# Patient Record
Sex: Female | Born: 1940 | Race: White | Hispanic: No | Marital: Married | Smoking: Former smoker
Health system: Southern US, Community
[De-identification: ages and names within clinical notes are randomized; demographics above are authoritative.]

## PROBLEM LIST (undated history)

## (undated) DIAGNOSIS — T7840XA Allergy, unspecified, initial encounter: Secondary | ICD-10-CM

## (undated) DIAGNOSIS — Z8619 Personal history of other infectious and parasitic diseases: Secondary | ICD-10-CM

## (undated) HISTORY — DX: Allergy, unspecified, initial encounter: T78.40XA

## (undated) HISTORY — DX: Personal history of other infectious and parasitic diseases: Z86.19

---

## 2020-04-18 ENCOUNTER — Ambulatory Visit: Payer: Self-pay | Admitting: Family Medicine

## 2020-05-07 ENCOUNTER — Encounter: Payer: Self-pay | Admitting: Family Medicine

## 2020-05-07 ENCOUNTER — Ambulatory Visit (INDEPENDENT_AMBULATORY_CARE_PROVIDER_SITE_OTHER): Payer: Medicare Other | Admitting: Family Medicine

## 2020-05-07 ENCOUNTER — Other Ambulatory Visit: Payer: Self-pay

## 2020-05-07 VITALS — BP 164/90 | HR 114 | Temp 98.7°F | Ht 62.0 in | Wt 128.5 lb

## 2020-05-07 DIAGNOSIS — R011 Cardiac murmur, unspecified: Secondary | ICD-10-CM | POA: Diagnosis not present

## 2020-05-07 DIAGNOSIS — M25561 Pain in right knee: Secondary | ICD-10-CM | POA: Diagnosis not present

## 2020-05-07 DIAGNOSIS — E2839 Other primary ovarian failure: Secondary | ICD-10-CM

## 2020-05-07 DIAGNOSIS — R030 Elevated blood-pressure reading, without diagnosis of hypertension: Secondary | ICD-10-CM | POA: Diagnosis not present

## 2020-05-07 DIAGNOSIS — Z13220 Encounter for screening for lipoid disorders: Secondary | ICD-10-CM

## 2020-05-07 DIAGNOSIS — R5383 Other fatigue: Secondary | ICD-10-CM

## 2020-05-07 MED ORDER — SHINGRIX 50 MCG/0.5ML IM SUSR: 0.5000 mL | Freq: Once | INTRAMUSCULAR | 0 refills | Status: AC

## 2020-05-07 NOTE — Progress Notes (Signed)
Suzanne Morris DOB: 17-Nov-1940 Encounter date: 05/07/2020  This is a 79 y.o. female who presents to establish care. Chief Complaint  Patient presents with  . Establish Care    History of present illness:  She is anxious, feels that it aggravated bp today. She has not had regular doctor of any sort in decades.   Has to get dental implants; worries about this.   Right knee pain. Had abrupt stop playing tennis that caused some discomfort and then slight twist/landing on knee that made it sore. This occurred back in July. It is better, but not 100%. She has not resumed tennis. She is afraid to worsen problem. No swelling, no bruising.   Seasonal allergies - mostly in spring. Seems related to grass. Sneezing, running nose. Takes otc alavert if she can find it without making her drowsy.   Exercises regularly tennis, walking.   Husband had detached retina. This was stressful for her. 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 Just easily worries about things.  Never had mammogram. Had bone density done through [facility] and this was good.   Usually takes emergen-C during flu season.   Does check bp at home on occasion yesterday was 129/68.   Energy is not quite what it used to be but generally good.   Past Medical History:  Diagnosis Date  . Allergy   . History of chickenpox    History reviewed. No pertinent surgical history. No Known Allergies Current Meds  Medication Sig  . Coenzyme Q10 (COQ10 PO) Take by mouth daily.  . Multiple Vitamins-Minerals (MULTIVITAMIN ADULT) CHEW Chew by mouth.   Social History   Tobacco Use  . Smoking status: Former Smoker    Packs/day: 0.25    Years: 2.00    Pack years: 0.50  . Smokeless tobacco: Never Used  Substance Use Topics  . Alcohol use: Yes    Alcohol/week: 3.0 - 4.0 standard drinks    Types: 3 - 4 Glasses of wine per week   Family History  Problem Relation Age of Onset  . Hypertension Mother   . Kidney disease Son         underdeveloped kidneys, transplant  . Other Father        rheumatic fever  . COPD Father        smoker  . Heart failure Father      Review of Systems  Constitutional: Negative for chills, fatigue and fever.  Respiratory: Negative for cough, chest tightness, shortness of breath and wheezing.   Cardiovascular: Negative for chest pain, palpitations and leg swelling.  Musculoskeletal: Positive for arthralgias.    Objective:  BP (!) 164/90 (BP Location: Left Arm, Patient Position: Sitting, Cuff Size: Normal)   Pulse (!) 114   Temp 98.7 F (37.1 C) (Oral)   Ht 5\' 2"  (1.575 m)   Wt 128 lb 8 oz (58.3 kg)   BMI 23.50 kg/m   Weight: 128 lb 8 oz (58.3 kg)   BP Readings from Last 3 Encounters:  05/07/20 (!) 164/90   Wt Readings from Last 3 Encounters:  05/07/20 128 lb 8 oz (58.3 kg)    Physical Exam Constitutional:      General: She is not in acute distress.    Appearance: She is well-developed.  Cardiovascular:     Rate and Rhythm: Regular rhythm. Tachycardia present.     Heart sounds: Murmur heard.  Crescendo decrescendo systolic murmur is present with a grade of 2/6.  No friction rub.  Pulmonary:     Effort: Pulmonary effort is normal. No respiratory distress.     Breath sounds: Normal breath sounds. No wheezing or rales.  Musculoskeletal:     Right lower leg: No edema.     Left lower leg: No edema.     Comments: Full range of motion bilateral knees.  No instability, medial or lateral joint line tenderness, no patellar tenderness, no laxity of the joint.  Does feel that there is mild edema of the right knee.  Neurological:     Mental Status: She is alert and oriented to person, place, and time.  Psychiatric:        Behavior: Behavior normal.     Assessment/Plan: 1. Elevated blood pressure reading She is very anxious today.  She has not had a regular doctor and is nervous about seeing 1.  Recheck of her blood pressure was still high.  I have encouraged her to check at  home and to report numbers back to me we may check in with blood work results.  Follow-up pending her report. - CBC with Differential/Platelet; Future - Comprehensive metabolic panel; Future - Comprehensive metabolic panel - CBC with Differential/Platelet  2. Murmur, cardiac Since she is a new patient and murmur is pronounced, I think it is reasonable to obtain a baseline echo.  We discussed this together in the office. - ECHOCARDIOGRAM COMPLETE; Future  3. Right knee pain, unspecified chronicity I have encouraged her to slowly resume activity with the knee to see if it bothers her.  She has been walking, but not running.  I encouraged her to start with light jogging and see if the knee hurts.  If so, we can further evaluate either with x-ray/imaging or can send a specialist.  4. Lipid screening - Lipid panel; Future - Lipid panel  5. Fatigue, unspecified type Start with blood work.  Patient reports this is mild, but since she has not had any recent blood work will make sure to include B6 that could contribute to fatigue. - TSH; Future - TSH  6. Estrogen deficiency - DG Bone Density; Future   We discussed other routine preventative healthcare measures including colonoscopy/Cologuard, mammogram, immunizations.  She does not wish to pursue anything besides what we started with today.  We will rediscuss at next visit.  Return for pending labs.  Theodis Shove, MD

## 2020-05-07 NOTE — Patient Instructions (Addendum)
You can set up your booster at [email or web address]   Consider shingles vaccine  Consider Tdap (this is tetanus with pertussis and you can complete at the pharmacy)  Check your blood pressure at home and write down numbers. We want you to have blood pressures 135/85 or less routinely when you check. I will check back in with you about this.

## 2020-05-10 LAB — CBC WITH DIFFERENTIAL/PLATELET
Absolute Monocytes: 489 cells/uL (ref 200–950)
Basophils Absolute: 44 cells/uL (ref 0–200)
Basophils Relative: 0.6 %
Eosinophils Absolute: 88 cells/uL (ref 15–500)
Eosinophils Relative: 1.2 %
HCT: 44.8 % (ref 35.0–45.0)
Hemoglobin: 14.8 g/dL (ref 11.7–15.5)
Lymphs Abs: 2343 cells/uL (ref 850–3900)
MCH: 30.5 pg (ref 27.0–33.0)
MCHC: 33 g/dL (ref 32.0–36.0)
MCV: 92.2 fL (ref 80.0–100.0)
MPV: 11.1 fL (ref 7.5–12.5)
Monocytes Relative: 6.7 %
Neutro Abs: 4336 cells/uL (ref 1500–7800)
Neutrophils Relative %: 59.4 %
Platelets: 263 10*3/uL (ref 140–400)
RBC: 4.86 10*6/uL (ref 3.80–5.10)
RDW: 12.2 % (ref 11.0–15.0)
Total Lymphocyte: 32.1 %
WBC: 7.3 10*3/uL (ref 3.8–10.8)

## 2020-05-10 LAB — LIPID PANEL
Cholesterol: 247 mg/dL — ABNORMAL HIGH (ref <200)
HDL: 63 mg/dL (ref >=50)
LDL Cholesterol (Calc): 161 mg/dL (calc) — ABNORMAL HIGH
Non-HDL Cholesterol (Calc): 184 mg/dL (calc) — ABNORMAL HIGH (ref <130)
Total CHOL/HDL Ratio: 3.9 (calc) (ref <5.0)
Triglycerides: 110 mg/dL (ref <150)

## 2020-05-10 LAB — COMPREHENSIVE METABOLIC PANEL
AG Ratio: 1.7 (calc) (ref 1.0–2.5)
ALT: 17 U/L (ref 6–29)
AST: 21 U/L (ref 10–35)
Albumin: 4.6 g/dL (ref 3.6–5.1)
Alkaline phosphatase (APISO): 78 U/L (ref 37–153)
BUN: 14 mg/dL (ref 7–25)
CO2: 29 mmol/L (ref 20–32)
Calcium: 10 mg/dL (ref 8.6–10.4)
Chloride: 105 mmol/L (ref 98–110)
Creat: 0.85 mg/dL (ref 0.60–0.93)
Globulin: 2.7 g/dL (calc) (ref 1.9–3.7)
Glucose, Bld: 118 mg/dL — ABNORMAL HIGH (ref 65–99)
Potassium: 5.6 mmol/L — ABNORMAL HIGH (ref 3.5–5.3)
Sodium: 140 mmol/L (ref 135–146)
Total Bilirubin: 0.6 mg/dL (ref 0.2–1.2)
Total Protein: 7.3 g/dL (ref 6.1–8.1)

## 2020-05-10 LAB — TEST AUTHORIZATION

## 2020-05-10 LAB — T4, FREE: Free T4: 1.1 ng/dL (ref 0.8–1.8)

## 2020-05-10 LAB — T3, FREE: T3, Free: 2.9 pg/mL (ref 2.3–4.2)

## 2020-05-10 LAB — TSH: TSH: 6.01 mIU/L — ABNORMAL HIGH (ref 0.40–4.50)

## 2020-05-20 ENCOUNTER — Other Ambulatory Visit: Payer: Self-pay | Admitting: Family Medicine

## 2020-05-20 DIAGNOSIS — E039 Hypothyroidism, unspecified: Secondary | ICD-10-CM

## 2020-05-20 DIAGNOSIS — E875 Hyperkalemia: Secondary | ICD-10-CM

## 2020-05-20 DIAGNOSIS — R739 Hyperglycemia, unspecified: Secondary | ICD-10-CM

## 2020-05-31 ENCOUNTER — Other Ambulatory Visit (HOSPITAL_COMMUNITY): Payer: Medicare Other

## 2020-06-18 ENCOUNTER — Other Ambulatory Visit (HOSPITAL_COMMUNITY): Payer: Medicare Other

## 2020-07-10 ENCOUNTER — Other Ambulatory Visit: Payer: Self-pay

## 2020-07-10 ENCOUNTER — Ambulatory Visit (HOSPITAL_COMMUNITY): Payer: Medicare Other | Attending: Internal Medicine

## 2020-07-10 DIAGNOSIS — R011 Cardiac murmur, unspecified: Secondary | ICD-10-CM | POA: Diagnosis not present

## 2020-07-10 LAB — ECHOCARDIOGRAM COMPLETE
AR max vel: 3.12 cm2
AV Area VTI: 3.28 cm2
AV Area mean vel: 3.06 cm2
AV Mean grad: 16 mmHg
AV Peak grad: 28.6 mmHg
Ao pk vel: 2.68 m/s
Area-P 1/2: 3.28 cm2
P 1/2 time: 418 msec
S' Lateral: 2.1 cm

## 2020-07-11 ENCOUNTER — Ambulatory Visit (INDEPENDENT_AMBULATORY_CARE_PROVIDER_SITE_OTHER): Payer: Medicare Other

## 2020-07-11 DIAGNOSIS — Z0000 Encounter for general adult medical examination without abnormal findings: Secondary | ICD-10-CM | POA: Diagnosis not present

## 2020-07-11 NOTE — Progress Notes (Signed)
Virtual Visit via Telephone Note  I connected with  Pearline Cables on 07/11/20 at  8:00 AM EST by telephone and verified that I am speaking with the correct person using two identifiers.  Medicare Annual Wellness visit completed telephonically due to Covid-19 pandemic.   Persons participating in this call: This Health Coach and this patient.   Location: Patient: Home Provider: Office   I discussed the limitations, risks, security and privacy concerns of performing an evaluation and management service by telephone and the availability of in person appointments. The patient expressed understanding and agreed to proceed.  Unable to perform video visit due to video visit attempted and failed and/or patient does not have video capability.   Some vital signs may be absent or patient reported.   Marzella Schlein, LPN    Subjective:   Suzanne Morris is a 79 y.o. female who presents for an Initial Medicare Annual Wellness Visit.  Review of Systems     Cardiac Risk Factors include: advanced age (>3men, >52 women)     Objective:    There were no vitals filed for this visit. There is no height or weight on file to calculate BMI.  Advanced Directives 07/11/2020  Does Patient Have a Medical Advance Directive? Yes  Does patient want to make changes to medical advance directive? No - Patient declined    Current Medications (verified) Outpatient Encounter Medications as of 07/11/2020  Medication Sig  . Coenzyme Q10 (COQ10 PO) Take by mouth daily.  . Multiple Vitamins-Minerals (MULTIVITAMIN ADULT) CHEW Chew by mouth.   No facility-administered encounter medications on file as of 07/11/2020.    Allergies (verified) Patient has no known allergies.   History: Past Medical History:  Diagnosis Date  . Allergy   . History of chickenpox    History reviewed. No pertinent surgical history. Family History  Problem Relation Age of Onset  . Hypertension Mother   . Kidney disease  Son        underdeveloped kidneys, transplant  . Other Father        rheumatic fever  . COPD Father        smoker  . Heart failure Father    Social History   Socioeconomic History  . Marital status: Married    Spouse name: Not on file  . Number of children: Not on file  . Years of education: Not on file  . Highest education level: Not on file  Occupational History  . Occupation: Retired  Tobacco Use  . Smoking status: Former Smoker    Packs/day: 0.25    Years: 2.00    Pack years: 0.50  . Smokeless tobacco: Never Used  Vaping Use  . Vaping Use: Never used  Substance and Sexual Activity  . Alcohol use: Yes    Alcohol/week: 3.0 - 4.0 standard drinks    Types: 3 - 4 Glasses of wine per week  . Drug use: Never  . Sexual activity: Yes    Partners: Male  Other Topics Concern  . Not on file  Social History Narrative  . Not on file   Social Determinants of Health   Financial Resource Strain: Low Risk   . Difficulty of Paying Living Expenses: Not hard at all  Food Insecurity: No Food Insecurity  . Worried About Programme researcher, broadcasting/film/video in the Last Year: Never true  . Ran Out of Food in the Last Year: Never true  Transportation Needs: No Transportation Needs  . Lack of Transportation (  Medical): No  . Lack of Transportation (Non-Medical): No  Physical Activity: Sufficiently Active  . Days of Exercise per Week: 5 days  . Minutes of Exercise per Session: 50 min  Stress: No Stress Concern Present  . Feeling of Stress : Not at all  Social Connections: Socially Integrated  . Frequency of Communication with Friends and Family: More than three times a week  . Frequency of Social Gatherings with Friends and Family: More than three times a week  . Attends Religious Services: More than 4 times per year  . Active Member of Clubs or Organizations: Yes  . Attends BankerClub or Organization Meetings: 1 to 4 times per year  . Marital Status: Married    Tobacco Counseling Counseling given:  Not Answered   Clinical Intake:  Pre-visit preparation completed: Yes  Pain : No/denies pain     BMI - recorded: 23.5 Nutritional Status: BMI of 19-24  Normal Nutritional Risks: None Diabetes: No  How often do you need to have someone help you when you read instructions, pamphlets, or other written materials from your doctor or pharmacy?: 1 - Never  Diabetic?No  Interpreter Needed?: No  Information entered by :: Lanier Ensignina Sou Nohr, LPN   Activities of Daily Living In your present state of health, do you have any difficulty performing the following activities: 07/11/2020  Hearing? N  Vision? N  Difficulty concentrating or making decisions? N  Walking or climbing stairs? N  Dressing or bathing? N  Doing errands, shopping? N  Preparing Food and eating ? N  Using the Toilet? N  In the past six months, have you accidently leaked urine? N  Do you have problems with loss of bowel control? N  Managing your Medications? N  Managing your Finances? N  Housekeeping or managing your Housekeeping? N  Some recent data might be hidden    Patient Care Team: Wynn BankerKoberlein, Junell C, MD as PCP - General (Family Medicine)  Indicate any recent Medical Services you may have received from other than [facility] providers in the past year (date may be approximate).     Assessment:   This is a routine wellness examination for Suzanne Morris.  Hearing/Vision screen  Hearing Screening   125Hz  250Hz  500Hz  1000Hz  2000Hz  3000Hz  4000Hz  6000Hz  8000Hz   Right ear:           Left ear:           Comments: Pt denies any hearing at this time  Vision Screening Comments: [facility]  Dietary issues and exercise activities discussed: Current Exercise Habits: Home exercise routine, Type of exercise: walking (swim and tennis), Time (Minutes): 45, Frequency (Times/Week): 5, Weekly Exercise (Minutes/Week): 225  Goals    . Patient Stated     Maintain health      Depression  Screen PHQ 2/9 Scores 07/11/2020  PHQ - 2 Score 0    Fall Risk Fall Risk  07/11/2020  Falls in the past year? 0  Number falls in past yr: 0  Injury with Fall? 0  Risk for fall due to : Impaired vision  Follow up Falls prevention discussed    FALL RISK PREVENTION PERTAINING TO THE HOME:  Any stairs in or around the home? Yes  If so, are there any without handrails? No  Home free of loose throw rugs in walkways, pet beds, electrical cords, etc? Yes  Adequate lighting in your home to reduce risk of falls? Yes   ASSISTIVE DEVICES UTILIZED TO PREVENT  FALLS:  Life alert? No  Use of a cane, walker or w/c? No  Grab bars in the bathroom? Yes  Shower chair or bench in shower? No  Elevated toilet seat or a handicapped toilet? No   TIMED UP AND GO:  Was the test performed? No .    Cognitive Function:     6CIT Screen 07/11/2020  What Year? 0 points  What month? 0 points  Count back from 20 0 points  Months in reverse 0 points  Repeat phrase 0 points    Immunizations Immunization History  Administered Date(s) Administered  . PFIZER SARS-COV-2 Vaccination 09/05/2019, 10/03/2019    TDAP status: Due, Education has been provided regarding the importance of this vaccine. Advised may receive this vaccine at local pharmacy or Health Dept. Aware to provide a copy of the vaccination record if obtained from local pharmacy or Health Dept. Verbalized acceptance and understanding.  Flu Vaccine status: Declined, Education has been provided regarding the importance of this vaccine but patient still declined. Advised may receive this vaccine at local pharmacy or Health Dept. Aware to provide a copy of the vaccination record if obtained from local pharmacy or Health Dept. Verbalized acceptance and understanding.  Pneumococcal vaccine status: Declined,  Education has been provided regarding the importance of this vaccine but patient still declined. Advised may receive this vaccine at local  pharmacy or Health Dept. Aware to provide a copy of the vaccination record if obtained from local pharmacy or Health Dept. Verbalized acceptance and understanding.   Covid-19 vaccine status: Completed vaccines  Qualifies for Shingles Vaccine? Yes   Zostavax completed No   Shingrix Completed?: No.    Education has been provided regarding the importance of this vaccine. Patient has been advised to call insurance company to determine out of pocket expense if they have not yet received this vaccine. Advised may also receive vaccine at local pharmacy or Health Dept. Verbalized acceptance and understanding.  Screening Tests Health Maintenance  Topic Date Due  . INFLUENZA VACCINE  11/01/2020 (Originally 03/04/2020)  . Hepatitis C Screening  05/07/2021 (Originally 05-Mar-1941)  . DEXA SCAN  07/11/2021 (Originally 12/30/2005)  . TETANUS/TDAP  07/11/2021 (Originally 12/31/1959)  . PNA vac Low Risk Adult (1 of 2 - PCV13) 07/11/2021 (Originally 12/30/2005)  . COVID-19 Vaccine  Completed    Health Maintenance  There are no preventive care reminders to display for this patient.  Colorectal cancer screening: No longer required.   Mammogram status: No longer required due to pt states she will speak to provider before making any test decisions.  Bone density declined until speaking with provider   Additional Screening:  Hepatitis C Screening: does qualify;   Vision Screening: Recommended annual ophthalmology exams for early detection of glaucoma and other disorders of the eye. Is the patient up to date with their annual eye exam?  Yes  Who is the provider or what is the name of the office in which the patient attends annual eye exams? [facility]   Dental Screening: Recommended annual dental exams for proper oral hygiene  Community Resource Referral / Chronic Care Management: CRR required this visit?  No   CCM required this visit?  No      Plan:     I have personally reviewed and noted  the following in the patient's chart:   . Medical and social history . Use of alcohol, tobacco or illicit drugs  . Current medications and supplements . Functional ability and status . Nutritional status .  Physical activity . Advanced directives . List of other physicians . Hospitalizations, surgeries, and ER visits in previous 12 months . Vitals . Screenings to include cognitive, depression, and falls . Referrals and appointments  In addition, I have reviewed and discussed with patient certain preventive protocols, quality metrics, and best practice recommendations. A written personalized care plan for preventive services as well as general preventive health recommendations were provided to patient.     Marzella Schlein, LPN   23/12/3612   Nurse Notes: None

## 2020-07-11 NOTE — Patient Instructions (Addendum)
Ms. Suzanne Morris , Thank you for taking time to come for your Medicare Wellness Visit. I appreciate your ongoing commitment to your health goals. Please review the following plan we discussed and let me know if I can assist you in the future.   Screening recommendations/referrals: Colonoscopy: Declined at this time/ no longer required  Mammogram: Declined  Bone Density: Declined Pt states she will confer with provider concerning further testing  Recommended yearly ophthalmology/optometry visit for glaucoma screening and checkup Recommended yearly dental visit for hygiene and checkup  Vaccinations: Influenza vaccine: Declined and discussed Pneumococcal vaccine: Declined and discussed Tdap vaccine: Declined and discussed Shingles vaccine: Declined and discussed   Covid-19:Completed 2/1, & 10/03/19  Advanced directives: Declined to provide copy at this time  Conditions/risks identified: Maintain health  Next appointment: Follow up in one year for your annual wellness visit    Preventive Care 65 Years and Older, Female Preventive care refers to lifestyle choices and visits with your health care provider that can promote health and wellness. What does preventive care include?  A yearly physical exam. This is also called an annual well check.  Dental exams once or twice a year.  Routine eye exams. Ask your health care provider how often you should have your eyes checked.  Personal lifestyle choices, including:  Daily care of your teeth and gums.  Regular physical activity.  Eating a healthy diet.  Avoiding tobacco and drug use.  Limiting alcohol use.  Practicing safe sex.  Taking low-dose aspirin every day.  Taking vitamin and mineral supplements as recommended by your health care provider. What happens during an annual well check? The services and screenings done by your health care provider during your annual well check will depend on your age, overall health, lifestyle risk  factors, and family history of disease. Counseling  Your health care provider may ask you questions about your:  Alcohol use.  Tobacco use.  Drug use.  Emotional well-being.  Home and relationship well-being.  Sexual activity.  Eating habits.  History of falls.  Memory and ability to understand (cognition).  Work and work Astronomer.  Reproductive health. Screening  You may have the following tests or measurements:  Height, weight, and BMI.  Blood pressure.  Lipid and cholesterol levels. These may be checked every 5 years, or more frequently if you are over 61 years old.  Skin check.  Lung cancer screening. You may have this screening every year starting at age 26 if you have a 30-pack-year history of smoking and currently smoke or have quit within the past 15 years.  Fecal occult blood test (FOBT) of the stool. You may have this test every year starting at age 73.  Flexible sigmoidoscopy or colonoscopy. You may have a sigmoidoscopy every 5 years or a colonoscopy every 10 years starting at age 36.  Hepatitis C blood test.  Hepatitis B blood test.  Sexually transmitted disease (STD) testing.  Diabetes screening. This is done by checking your blood sugar (glucose) after you have not eaten for a while (fasting). You may have this done every 1-3 years.  Bone density scan. This is done to screen for osteoporosis. You may have this done starting at age 76.  Mammogram. This may be done every 1-2 years. Talk to your health care provider about how often you should have regular mammograms. Talk with your health care provider about your test results, treatment options, and if necessary, the need for more tests. Vaccines  Your health care provider may  recommend certain vaccines, such as:  Influenza vaccine. This is recommended every year.  Tetanus, diphtheria, and acellular pertussis (Tdap, Td) vaccine. You may need a Td booster every 10 years.  Zoster vaccine. You  may need this after age 66.  Pneumococcal 13-valent conjugate (PCV13) vaccine. One dose is recommended after age 72.  Pneumococcal polysaccharide (PPSV23) vaccine. One dose is recommended after age 63. Talk to your health care provider about which screenings and vaccines you need and how often you need them. This information is not intended to replace advice given to you by your health care provider. Make sure you discuss any questions you have with your health care provider. Document Released: 08/17/2015 Document Revised: 04/09/2016 Document Reviewed: 05/22/2015 Elsevier Interactive Patient Education  2017 [employer] Prevention in the Home Falls can cause injuries. They can happen to people of all ages. There are many things you can do to make your home safe and to help prevent falls. What can I do on the outside of my home?  Regularly fix the edges of walkways and driveways and fix any cracks.  Remove anything that might make you trip as you walk through a door, such as a raised step or threshold.  Trim any bushes or trees on the path to your home.  Use bright outdoor lighting.  Clear any walking paths of anything that might make someone trip, such as rocks or tools.  Regularly check to see if handrails are loose or broken. Make sure that both sides of any steps have handrails.  Any raised decks and porches should have guardrails on the edges.  Have any leaves, snow, or ice cleared regularly.  Use sand or salt on walking paths during winter.  Clean up any spills in your garage right away. This includes oil or grease spills. What can I do in the bathroom?  Use night lights.  Install grab bars by the toilet and in the tub and shower. Do not use towel bars as grab bars.  Use non-skid mats or decals in the tub or shower.  If you need to sit down in the shower, use a plastic, non-slip stool.  Keep the floor dry. Clean up any water that spills on the floor as soon as  it happens.  Remove soap buildup in the tub or shower regularly.  Attach bath mats securely with double-sided non-slip rug tape.  Do not have throw rugs and other things on the floor that can make you trip. What can I do in the bedroom?  Use night lights.  Make sure that you have a light by your bed that is easy to reach.  Do not use any sheets or blankets that are too big for your bed. They should not hang down onto the floor.  Have a firm chair that has side arms. You can use this for support while you get dressed.  Do not have throw rugs and other things on the floor that can make you trip. What can I do in the kitchen?  Clean up any spills right away.  Avoid walking on wet floors.  Keep items that you use a lot in easy-to-reach places.  If you need to reach something above you, use a strong step stool that has a grab bar.  Keep electrical cords out of the way.  Do not use floor polish or wax that makes floors slippery. If you must use wax, use non-skid floor wax.  Do not have throw rugs and  other things on the floor that can make you trip. What can I do with my stairs?  Do not leave any items on the stairs.  Make sure that there are handrails on both sides of the stairs and use them. Fix handrails that are broken or loose. Make sure that handrails are as long as the stairways.  Check any carpeting to make sure that it is firmly attached to the stairs. Fix any carpet that is loose or worn.  Avoid having throw rugs at the top or bottom of the stairs. If you do have throw rugs, attach them to the floor with carpet tape.  Make sure that you have a light switch at the top of the stairs and the bottom of the stairs. If you do not have them, ask someone to add them for you. What else can I do to help prevent falls?  Wear shoes that:  Do not have high heels.  Have rubber bottoms.  Are comfortable and fit you well.  Are closed at the toe. Do not wear sandals.  If  you use a stepladder:  Make sure that it is fully opened. Do not climb a closed stepladder.  Make sure that both sides of the stepladder are locked into place.  Ask someone to hold it for you, if possible.  Clearly mark and make sure that you can see:  Any grab bars or handrails.  First and last steps.  Where the edge of each step is.  Use tools that help you move around (mobility aids) if they are needed. These include:  Canes.  Walkers.  Scooters.  Crutches.  Turn on the lights when you go into a dark area. Replace any light bulbs as soon as they burn out.  Set up your furniture so you have a clear path. Avoid moving your furniture around.  If any of your floors are uneven, fix them.  If there are any pets around you, be aware of where they are.  Review your medicines with your doctor. Some medicines can make you feel dizzy. This can increase your chance of falling. Ask your doctor what other things that you can do to help prevent falls. This information is not intended to replace advice given to you by your health care provider. Make sure you discuss any questions you have with your health care provider. Document Released: 05/17/2009 Document Revised: 12/27/2015 Document Reviewed: 08/25/2014 Elsevier Interactive Patient Education  2017 [employer].

## 2020-09-10 ENCOUNTER — Ambulatory Visit: Payer: Medicare Other | Admitting: Family Medicine

## 2020-10-05 ENCOUNTER — Ambulatory Visit: Payer: Medicare Other | Admitting: Family Medicine

## 2020-10-30 ENCOUNTER — Other Ambulatory Visit: Payer: Medicare Other

## 2020-10-31 ENCOUNTER — Encounter: Payer: Self-pay | Admitting: Family Medicine

## 2020-10-31 ENCOUNTER — Other Ambulatory Visit: Payer: Self-pay

## 2020-10-31 ENCOUNTER — Ambulatory Visit (INDEPENDENT_AMBULATORY_CARE_PROVIDER_SITE_OTHER): Payer: Medicare Other | Admitting: Family Medicine

## 2020-10-31 VITALS — BP 160/100 | HR 90 | Temp 98.1°F | Ht 62.0 in | Wt 123.5 lb

## 2020-10-31 DIAGNOSIS — I10 Essential (primary) hypertension: Secondary | ICD-10-CM | POA: Diagnosis not present

## 2020-10-31 DIAGNOSIS — F419 Anxiety disorder, unspecified: Secondary | ICD-10-CM | POA: Diagnosis not present

## 2020-10-31 DIAGNOSIS — I351 Nonrheumatic aortic (valve) insufficiency: Secondary | ICD-10-CM | POA: Diagnosis not present

## 2020-10-31 MED ORDER — ROSUVASTATIN CALCIUM 5 MG PO TABS: 5.0000 mg | ORAL_TABLET | Freq: Every day | ORAL | 3 refills | Status: DC

## 2020-10-31 MED ORDER — CLONAZEPAM 0.5 MG PO TABS: 0.2500 mg | ORAL_TABLET | Freq: Every day | ORAL | 0 refills | Status: DC | PRN

## 2020-10-31 NOTE — Patient Instructions (Addendum)
Check pressures a few times a week, but also when feeling more stressed. Let me know if regularly getting over 140 systolic or 90 diastolic.   Try 1/2 tab of the crestor (cholesterol medication) when you get it and see how you do with this for a week or so before increasing to full tablet. Let me know if any aches/pains.   Klonopin just if needed for anxiety.

## 2020-10-31 NOTE — Progress Notes (Signed)
Suzanne Morris DOB: Mar 07, 1941 Encounter date: 10/31/2020  This is a 80 y.o. female who presents with Chief Complaint  Patient presents with  . Follow-up    History of present illness: Last visit was 05/07/20. This was first doc visit in years and she was anxious.   Husband has had 3 retinal detachments in last year; she was just at hospital yesterday. Also son was in hospital in fall; so just a lot of stress. Did check pressures in jan, feb and numbers 118-135/62-75. Tried to check at home when by herself - sits for 5 minutes or so before she checks it. She can feel it go up when she is worried/anxious. A couple days ago 145/84; this week has been more stressful for her though. 148/86 (yesterday), 145/84, 130/78, 139/85, 123/74, 120/74, 141/90, 142/82, 143/84 this week were her readings. bp on her home cuff: 206/91 today. No chest pressure or pain, even when bp elevated. Feels fuzziness in forehead. No headaches. No dizziness or light headedness.   My recheck was 186/84  No Known Allergies Current Meds  Medication Sig  . clonazePAM (KLONOPIN) 0.5 MG tablet Take 0.5 tablets (0.25 mg total) by mouth daily as needed for anxiety.  . Coenzyme Q10 (COQ10 PO) Take by mouth daily.  . Multiple Vitamins-Minerals (MULTIVITAMIN ADULT) CHEW Chew by mouth.  . rosuvastatin (CRESTOR) 5 MG tablet Take 1 tablet (5 mg total) by mouth at bedtime.    Review of Systems  Constitutional: Negative for chills, fatigue and fever.  Respiratory: Negative for cough, chest tightness, shortness of breath and wheezing.   Cardiovascular: Negative for chest pain, palpitations and leg swelling.  Psychiatric/Behavioral:       Anxiety    Objective:  BP (!) 160/100 (BP Location: Left Arm, Patient Position: Sitting, Cuff Size: Normal)   Pulse 90   Temp 98.1 F (36.7 C) (Oral)   Ht 5\' 2"  (1.575 m)   Wt 123 lb 8 oz (56 kg)   SpO2 96%   BMI 22.59 kg/m   Weight: 123 lb 8 oz (56 kg)   BP Readings from Last 3  Encounters:  10/31/20 (!) 160/100  05/07/20 (!) 164/90   Wt Readings from Last 3 Encounters:  10/31/20 123 lb 8 oz (56 kg)  05/07/20 128 lb 8 oz (58.3 kg)    Physical Exam Constitutional:      General: She is not in acute distress.    Appearance: She is well-developed.  Cardiovascular:     Rate and Rhythm: Normal rate and regular rhythm.     Heart sounds: Murmur heard.   Crescendo decrescendo systolic murmur is present with a grade of 3/6. No friction rub.  Pulmonary:     Effort: Pulmonary effort is normal. No respiratory distress.     Breath sounds: Normal breath sounds. No wheezing or rales.  Musculoskeletal:     Right lower leg: No edema.     Left lower leg: No edema.  Neurological:     Mental Status: She is alert and oriented to person, place, and time.  Psychiatric:        Attention and Perception: Attention normal.        Mood and Affect: Mood normal.        Speech: Speech normal.        Behavior: Behavior normal.        Thought Content: Thought content normal.     Comments: She states that she has always been a 07/07/20. She does have anxiety  and can tell that she gets worked up about things. She knows it affects her blood pressure and worries about this. Hard for her to stop the worrying once it starts.     Assessment/Plan  1. Hypertension, unspecified type Pressures at home are really quite good and sometimes on lower end. I am hesitant to treat her with daily medication because I worry about hypotension.  We discussed considering as needed blood pressure medication, but I would do this with caution as well because I think some medications (like hydralazine) would be too potent for her.  May do well with an as needed hydrochlorothiazide.  In the meanwhile, I have asked her to check at home when she is feeling more stressed.  Would like to have a better idea of how often her pressure is spiking up at home.  I have asked her to let me know she is regularly getting  pressures at home over 140 systolic or 90 diastolic.  Her home cuff does appear to be reading accurately in comparison with my cuff in the office.  2. Moderate aortic regurgitation We discussed importance of blood pressure control and cholesterol control to prevent cardiac episodes.  She is agreeable to starting a statin medication after this discussion, which we discussed is a good preventative measure.  I encouraged her to start with just a half a tab of Crestor to see how she tolerated this and then increase to a full tab if able.  We discussed repeating an echo in a year to make sure stable.  She prefers to hold off on seeing cardiology at this point, which I agree with since she is not symptomatic.  3. Anxiety I did give her some Klonopin.  We discussed a daily medication for anxiety, but I do not feel that she needs that at this point in time.  We are going to see how she does with as needed Klonopin and see if this is something that may be a tool to help her not spiral into more severe anxiety episode/worse hypertension.  We will keep a closer follow-up for her so that we can recheck on the above.    Return in about 3 months (around 01/31/2021) for wellness visit.    42 minutes spent in time with patient, exam, discussion of cardiovascular imaging results, discussion of blood pressure and cholesterol control, discussion of treatment options for anxiety as well as blood pressure. Theodis Shove, MD

## 2020-11-01 DIAGNOSIS — I10 Essential (primary) hypertension: Secondary | ICD-10-CM | POA: Insufficient documentation

## 2020-11-01 DIAGNOSIS — I351 Nonrheumatic aortic (valve) insufficiency: Secondary | ICD-10-CM | POA: Insufficient documentation

## 2021-02-06 ENCOUNTER — Encounter: Payer: Self-pay | Admitting: Family Medicine

## 2021-02-06 ENCOUNTER — Other Ambulatory Visit: Payer: Self-pay

## 2021-02-06 ENCOUNTER — Ambulatory Visit (INDEPENDENT_AMBULATORY_CARE_PROVIDER_SITE_OTHER): Payer: Medicare Other | Admitting: Family Medicine

## 2021-02-06 VITALS — BP 130/70 | HR 94 | Temp 98.3°F | Ht 62.0 in | Wt 122.6 lb

## 2021-02-06 DIAGNOSIS — E039 Hypothyroidism, unspecified: Secondary | ICD-10-CM | POA: Diagnosis not present

## 2021-02-06 DIAGNOSIS — E785 Hyperlipidemia, unspecified: Secondary | ICD-10-CM

## 2021-02-06 DIAGNOSIS — I351 Nonrheumatic aortic (valve) insufficiency: Secondary | ICD-10-CM

## 2021-02-06 DIAGNOSIS — R739 Hyperglycemia, unspecified: Secondary | ICD-10-CM | POA: Diagnosis not present

## 2021-02-06 DIAGNOSIS — Z0000 Encounter for general adult medical examination without abnormal findings: Secondary | ICD-10-CM

## 2021-02-06 LAB — HEMOGLOBIN A1C: Hgb A1c MFr Bld: 5.7 % (ref 4.6–6.5)

## 2021-02-06 LAB — LIPID PANEL
Cholesterol: 137 mg/dL (ref 0–200)
HDL: 49.1 mg/dL (ref >39.00)
LDL Cholesterol: 73 mg/dL (ref 0–99)
NonHDL: 87.4
Total CHOL/HDL Ratio: 3
Triglycerides: 74 mg/dL (ref 0.0–149.0)
VLDL: 14.8 mg/dL (ref 0.0–40.0)

## 2021-02-06 LAB — COMPREHENSIVE METABOLIC PANEL
ALT: 22 U/L (ref 0–35)
AST: 23 U/L (ref 0–37)
Albumin: 4.5 g/dL (ref 3.5–5.2)
Alkaline Phosphatase: 77 U/L (ref 39–117)
BUN: 23 mg/dL (ref 6–23)
CO2: 26 mEq/L (ref 19–32)
Calcium: 9.1 mg/dL (ref 8.4–10.5)
Chloride: 106 mEq/L (ref 96–112)
Creatinine, Ser: 0.88 mg/dL (ref 0.40–1.20)
GFR: 62.21 mL/min (ref >60.00)
Glucose, Bld: 94 mg/dL (ref 70–99)
Potassium: 3.7 mEq/L (ref 3.5–5.1)
Sodium: 141 mEq/L (ref 135–145)
Total Bilirubin: 0.5 mg/dL (ref 0.2–1.2)
Total Protein: 6.9 g/dL (ref 6.0–8.3)

## 2021-02-06 LAB — T4, FREE: Free T4: 0.66 ng/dL (ref 0.60–1.60)

## 2021-02-06 LAB — TSH: TSH: 5.21 u[IU]/mL (ref 0.35–5.50)

## 2021-02-06 LAB — T3, FREE: T3, Free: 3.4 pg/mL (ref 2.3–4.2)

## 2021-02-06 NOTE — Progress Notes (Signed)
Patient: Suzanne Morris, Female    DOB: Dec 04, 1940, 80 y.o.   MRN: [id number] Visit Date: 02/07/2021  Today's Provider: Theodis Shove, MD   Chief Complaint  Patient presents with   Follow-up   Subjective:   Initial preventative physical exam  Suzanne Morris  is a 80 y.o. female who presents today for her Initial Preventative Physical Exam (although she has been on medicare for over 10 years). She feels well. She reports exercising at least 4 days a week. She reports she is sleeping fairly well.  HPI Doing well - walked 2 miles this morning.   Blood pressure: 135/74 HR 77; 133/68 HR 75; 121/59 HR 70. Days when she has more scheduled pressure is up.   HL: started with half tab of crestor - then went to hole and no side effects.   Anxiety: tried half tab of klonopin. Just used two times since last visit.   Sleeps usually just 6 hours. Feels ok after this; just feels she should be sleeping more.   She is worried about radiation exposure with bone density; but is completing in august; we reviewed that this is less than cxr.   Living arrangements - the patient lives with their spouse. Hearing screen: completed in office today.  Hearing Screening   500Hz  1000Hz  2000Hz  4000Hz   Right ear Pass Pass Pass Pass  Left ear Pass Pass Pass Pass    Vision screen:follows with ophtholmology. Appointment last week.   Review of Systems  Constitutional:  Negative for activity change, appetite change, chills, fatigue, fever and unexpected weight change.  HENT:  Negative for congestion, ear pain, hearing loss, sinus pressure, sinus pain, sore throat and trouble swallowing.   Eyes:  Negative for pain and visual disturbance.  Respiratory:  Negative for cough, chest tightness, shortness of breath and wheezing.   Cardiovascular:  Negative for chest pain, palpitations and leg swelling.  Gastrointestinal:  Negative for abdominal pain, blood in stool, constipation, diarrhea, nausea and vomiting.   Genitourinary:  Negative for difficulty urinating and menstrual problem.  Musculoskeletal:  Negative for arthralgias and back pain.  Skin:  Negative for rash.  Neurological:  Negative for dizziness, weakness, numbness and headaches.  Hematological:  Negative for adenopathy. Does not bruise/bleed easily.  Psychiatric/Behavioral:  Negative for sleep disturbance and suicidal ideas. The patient is not nervous/anxious.    Social History   Socioeconomic History   Marital status: Married    Spouse name: Not on file   Number of children: Not on file   Years of education: Not on file   Highest education level: Not on file  Occupational History   Occupation: Retired  Tobacco Use   Smoking status: Former    Packs/day: 0.25    Years: 2.00    Pack years: 0.50    Types: Cigarettes   Smokeless tobacco: Never  Vaping Use   Vaping Use: Never used  Substance and Sexual Activity   Alcohol use: Yes    Alcohol/week: 3.0 - 4.0 standard drinks    Types: 3 - 4 Glasses of wine per week   Drug use: Never   Sexual activity: Yes    Partners: Male  Other Topics Concern   Not on file  Social History Narrative   Not on file   Social Determinants of Health   Financial Resource Strain: Low Risk    Difficulty of Paying Living Expenses: Not hard at all  Food Insecurity: No Food Insecurity   Worried About  in the Last Year: Never true   Ran Out of Food in the Last Year: Never true  Transportation Needs: No Transportation Needs   Lack of Transportation (Medical): No   Lack of Transportation (Non-Medical): No  Physical Activity: Sufficiently Active   Days of Exercise per Week: 5 days   Minutes of Exercise per Session: 50 min  Stress: No Stress Concern Present   Feeling of Stress : Not at all  Social Connections: Socially Integrated   Frequency of Communication with Friends and Family: More than three times a week   Frequency of Social Gatherings with Friends and Family: More  than three times a week   Attends Religious Services: More than 4 times per year   Active Member of [employer] or Organizations: Yes   Attends Banker Meetings: 1 to 4 times per year   Marital Status: Married  Catering manager Violence: Not At Risk   Fear of Current or Ex-Partner: No   Emotionally Abused: No   Physically Abused: No   Sexually Abused: No    Patient Active Problem List   Diagnosis Date Noted   Hypertension 11/01/2020   Moderate aortic regurgitation 11/01/2020    No past surgical history on file.  Her family history includes COPD in her father; Heart failure in her father; Hypertension in her mother; Kidney disease in her son; Other in her father.     Previous Medications   CLONAZEPAM (KLONOPIN) 0.5 MG TABLET    Take 0.5 tablets (0.25 mg total) by mouth daily as needed for anxiety.   COENZYME Q10 (COQ10 PO)    Take by mouth daily.   MULTIPLE VITAMINS-MINERALS (MULTIVITAMIN ADULT) CHEW    Chew by mouth.   ROSUVASTATIN (CRESTOR) 5 MG TABLET    Take 1 tablet (5 mg total) by mouth at bedtime.    Patient Care Team: Wynn Banker, MD as PCP - General (Family Medicine)      Objective:   Vitals: BP 130/70 (BP Location: Left Arm, Patient Position: Sitting, Cuff Size: Normal)   Pulse 94   Temp 98.3 F (36.8 C) (Oral)   Ht 5\' 2"  (1.575 m)   Wt 122 lb 9.6 oz (55.6 kg)   SpO2 98%   BMI 22.42 kg/m   Physical Exam Constitutional:      General: She is not in acute distress.    Appearance: She is well-developed.  HENT:     Head: Normocephalic and atraumatic.     Right Ear: External ear normal.     Left Ear: External ear normal.     Mouth/Throat:     Pharynx: No oropharyngeal exudate.  Eyes:     Conjunctiva/sclera: Conjunctivae normal.     Pupils: Pupils are equal, round, and reactive to light.  Neck:     Thyroid: No thyromegaly.  Cardiovascular:     Rate and Rhythm: Normal rate and regular rhythm.     Heart sounds: Murmur heard.  Crescendo  systolic murmur is present with a grade of 2/6.    No friction rub. No gallop.  Pulmonary:     Effort: Pulmonary effort is normal.     Breath sounds: Normal breath sounds.  Chest:  Breasts:    Right: Normal. No axillary adenopathy or supraclavicular adenopathy.     Left: Normal. No axillary adenopathy or supraclavicular adenopathy.  Abdominal:     General: Bowel sounds are normal. There is no distension.     Palpations: Abdomen is soft. There is no mass.  Tenderness: There is no abdominal tenderness. There is no guarding.     Hernia: No hernia is present.  Musculoskeletal:        General: No tenderness or deformity. Normal range of motion.     Cervical back: Normal range of motion and neck supple.  Lymphadenopathy:     Cervical: No cervical adenopathy.     Upper Body:     Right upper body: No supraclavicular, axillary or pectoral adenopathy.     Left upper body: No supraclavicular, axillary or pectoral adenopathy.  Skin:    General: Skin is warm and dry.     Findings: No rash.     Comments: Scattered seborrheic keratosis over skin, back.   Neurological:     Mental Status: She is alert and oriented to person, place, and time.     Deep Tendon Reflexes: Reflexes normal.     Reflex Scores:      Tricep reflexes are 2+ on the right side and 2+ on the left side.      Bicep reflexes are 2+ on the right side and 2+ on the left side.      Brachioradialis reflexes are 2+ on the right side and 2+ on the left side.      Patellar reflexes are 2+ on the right side and 2+ on the left side. Psychiatric:        Speech: Speech normal.        Behavior: Behavior normal.        Thought Content: Thought content normal.     Hearing Screening   500Hz  1000Hz  2000Hz  4000Hz   Right ear Pass Pass Pass Pass  Left ear Pass Pass Pass Pass    Activities of Daily Living In your present state of health, do you have any difficulty performing the following activities: 07/11/2020  Hearing? N  Vision? N   Difficulty concentrating or making decisions? N  Walking or climbing stairs? N  Dressing or bathing? N  Doing errands, shopping? N  Preparing Food and eating ? N  Using the Toilet? N  In the past six months, have you accidently leaked urine? N  Do you have problems with loss of bowel control? N  Managing your Medications? N  Managing your Finances? N  Housekeeping or managing your Housekeeping? N  Some recent data might be hidden    Fall Risk Assessment Fall Risk  02/06/2021 07/11/2020  Falls in the past year? 0 0  Number falls in past yr: 0 0  Injury with Fall? - 0  Risk for fall due to : - Impaired vision  Follow up - Falls prevention discussed     Patient reports there are safety devices in place in shower at home.   Depression Screen PHQ 2/9 Scores 02/06/2021 07/11/2020  PHQ - 2 Score 0 0  PHQ- 9 Score 2 -    Cognitive Testing - 6-CIT   Correct? Score   What year is it? yes 0 Yes = 0    No = 4  What month is it? yes 0 Yes = 0    No = 3  Remember:     [place] [address] 14[place]     What time is it? yes 0 Yes = 0    No = 3  Count backwards from 20 to 1 yes 0 Correct = 0    1 error = 2   More than 1 error = 4  Say the months of the year  in reverse. yes 0 Correct = 0    1 error = 2   More than 1 error = 4  What address did I ask you to remember? yes 1 Correct = 0  1 error = 2    2 error = 4    3 error = 6    4 error = 8    All wrong = 10       TOTAL SCORE  1/28   Interpretation:  Normal  Normal (0-7) Abnormal (8-28)    Health Maintenance  Topic Date Due   Zoster Vaccines- Shingrix (2 of 2) 10/05/2020   COVID-19 Vaccine (4 - Booster for Pfizer series) 11/28/2020   DEXA SCAN  07/11/2021 (Originally 12/30/2005)   TETANUS/TDAP  07/11/2021 (Originally 12/31/1959)   PNA vac Low Risk Adult (1 of 2 - PCV13) 07/11/2021 (Originally 12/30/2005)   INFLUENZA VACCINE  03/04/2021   HPV VACCINES  Aged Out    Assessment & Plan:    Initial Preventative Physical  Exam  Reviewed patient's Family Medical History Reviewed and updated list of patient's medical providers Assessment of cognitive impairment was done Assessed patient's functional ability Established a written schedule for health screening services Health Risk Assessent Completed and Reviewed  Exercise Activities and Dietary recommendations  Goals      Patient Stated     Maintain health         Immunization History  Administered Date(s) Administered   PFIZER(Purple Top)SARS-COV-2 Vaccination 09/05/2019, 10/03/2019, 07/30/2020   Zoster Recombinat (Shingrix) 08/10/2020    Health Maintenance  Topic Date Due   Zoster Vaccines- Shingrix (2 of 2) 10/05/2020   COVID-19 Vaccine (4 - Booster for Pfizer series) 11/28/2020   DEXA SCAN  07/11/2021 (Originally 12/30/2005)   TETANUS/TDAP  07/11/2021 (Originally 12/31/1959)   PNA vac Low Risk Adult (1 of 2 - PCV13) 07/11/2021 (Originally 12/30/2005)   INFLUENZA VACCINE  03/04/2021   HPV VACCINES  Aged Out     Discussed health benefits of physical activity, and encouraged her to engage in regular exercise appropriate for her age and condition.    ------------------------------------------------------------------------------------------------------------ 1. Encounter for annual wellness exam in Medicare patient Keep up with healthy lifestyle. She is going to complete 4th covid shot, pneumonia shot after her upcoming trip. We discussed timeline for these.   2. Moderate aortic regurgitation Last echo was 07/2020. Bp is better controlled today. Consider cardiology referral in future, but will continue to monitor for patient in meanwhile.  3. Hyperglycemia - Hemoglobin A1c; Future - Hemoglobin A1c  4. Hypothyroidism, unspecified type If TSH staying about the same, will continue to monitor q 6-12 months. Discussed that TSH can increase with age and if not worsening or symptomatic with this we will hold off on treatment.  - TSH; Future -  T4, free; Future - T3, free; Future - T3, free - TSH - T4, free  5. Hyperlipidemia, unspecified hyperlipidemia type Tolerating crestor well. Recheck labs today. - Comprehensive metabolic panel; Future - Lipid panel; Future - Comprehensive metabolic panel - Lipid panel   Return for return for nurse visit for prevnar 20; visit with me in 6 months pending bloodwork.  May return early for seborrheic freezing/removal.   Theodis ShoveJunell Laiken Sandy, MD

## 2021-03-04 ENCOUNTER — Other Ambulatory Visit: Payer: Self-pay

## 2021-03-04 ENCOUNTER — Ambulatory Visit (INDEPENDENT_AMBULATORY_CARE_PROVIDER_SITE_OTHER): Payer: Medicare Other

## 2021-03-04 DIAGNOSIS — Z23 Encounter for immunization: Secondary | ICD-10-CM | POA: Diagnosis not present

## 2021-03-04 NOTE — Progress Notes (Signed)
Per orders from Dr Hassan Rowan, pt was given Prevnar 20 vaccine by Steffanie Rainwater, pt tolerated well.

## 2021-03-28 ENCOUNTER — Encounter: Payer: Self-pay | Admitting: Family Medicine

## 2021-03-28 ENCOUNTER — Ambulatory Visit (INDEPENDENT_AMBULATORY_CARE_PROVIDER_SITE_OTHER): Payer: Medicare Other | Admitting: Family Medicine

## 2021-03-28 ENCOUNTER — Other Ambulatory Visit: Payer: Self-pay

## 2021-03-28 VITALS — BP 100/100 | HR 82 | Temp 98.4°F | Wt 123.4 lb

## 2021-03-28 DIAGNOSIS — H6123 Impacted cerumen, bilateral: Secondary | ICD-10-CM | POA: Diagnosis not present

## 2021-03-28 DIAGNOSIS — I10 Essential (primary) hypertension: Secondary | ICD-10-CM | POA: Diagnosis not present

## 2021-03-28 DIAGNOSIS — F419 Anxiety disorder, unspecified: Secondary | ICD-10-CM | POA: Diagnosis not present

## 2021-03-28 NOTE — Progress Notes (Signed)
Subjective:    Patient ID: Suzanne Morris, female    DOB: 1940/09/02, 80 y.o.   MRN: [id number]  Chief Complaint  Patient presents with   Ear Fullness    Went to beach for vacation, woke up one morning and ear felt full.    HPI Patient was seen today for acute concern. Pt endorses L ear feeling clogged and uncomfortable since returning from the beach.  Otherwise feels good.  Denies HAs, fever, chills, neck pain, sore throat.  Past Medical History:  Diagnosis Date   Allergy    History of chickenpox     No Known Allergies  ROS General: Denies fever, chills, night sweats, changes in weight, changes in appetite HEENT: Denies headaches, ear pain, changes in vision, rhinorrhea, sore throat  +L ear clogged CV: Denies CP, palpitations, SOB, orthopnea Pulm: Denies SOB, cough, wheezing GI: Denies abdominal pain, nausea, vomiting, diarrhea, constipation GU: Denies dysuria, hematuria, frequency, vaginal discharge Msk: Denies muscle cramps, joint pains Neuro: Denies weakness, numbness, tingling Skin: Denies rashes, bruising Psych: Denies depression, hallucinations  +anxiety    Objective:    Blood pressure (!) 100/100, pulse 82, temperature 98.4 F (36.9 C), temperature source Oral, weight 123 lb 6.4 oz (56 kg), SpO2 96 %.  Gen. Pleasant, well-nourished, in no distress, normal affect   HEENT: [place]/AT, face symmetric, conjunctiva clear, no scleral icterus, PERRLA, EOMI, nares patent without drainage, Normal external ears b/l.  B/l cerumen impaction. TMs normal b/l in appearance after irrigation. Lungs: no accessory muscle use Cardiovascular: RRR, no peripheral edema Musculoskeletal: No deformities, no cyanosis or clubbing, normal tone Neuro:  A&Ox3, CN II-XII intact, normal gait Skin:  Warm, no lesions/ rash   Wt Readings from Last 3 Encounters:  02/06/21 122 lb 9.6 oz (55.6 kg)  10/31/20 123 lb 8 oz (56 kg)  05/07/20 128 lb 8 oz (58.3 kg)   BP Readings from Last 3 Encounters:   03/28/21 (!) 100/100  02/06/21 130/70  10/31/20 (!) 160/100     Lab Results  Component Value Date   WBC 7.3 05/07/2020   HGB 14.8 05/07/2020   HCT 44.8 05/07/2020   PLT 263 05/07/2020   GLUCOSE 94 02/06/2021   CHOL 137 02/06/2021   TRIG 74.0 02/06/2021   HDL 49.10 02/06/2021   LDLCALC 73 02/06/2021   ALT 22 02/06/2021   AST 23 02/06/2021   NA 141 02/06/2021   K 3.7 02/06/2021   CL 106 02/06/2021   CREATININE 0.88 02/06/2021   BUN 23 02/06/2021   CO2 26 02/06/2021   TSH 5.21 02/06/2021   HGBA1C 5.7 02/06/2021    Assessment/Plan:  Bilateral impacted cerumen -Consent obtained.  Bilateral ears irrigated.  Patient tolerated procedure well. -Consider OTC Debrox eardrops. -Given handout  Hypertension, unspecified type -uncontrolled.  An anxiety component contributing -last 3 bps in office elevated or uncontrolled. -lifestyle modifications -f/u with pcp  Anxiety -consider counseling and medication options  F/u with pcp in the next few wks  Abbe Amsterdam, MD

## 2021-04-02 ENCOUNTER — Ambulatory Visit
Admission: RE | Admit: 2021-04-02 | Discharge: 2021-04-02 | Disposition: A | Discharge status: Home / Self Care | Payer: Medicare Other | Source: Ambulatory Visit | Attending: Family Medicine | Admitting: Family Medicine

## 2021-04-02 ENCOUNTER — Other Ambulatory Visit: Payer: Self-pay

## 2021-04-02 DIAGNOSIS — E2839 Other primary ovarian failure: Secondary | ICD-10-CM

## 2021-04-10 ENCOUNTER — Encounter: Payer: Self-pay | Admitting: Family Medicine

## 2021-05-08 ENCOUNTER — Other Ambulatory Visit: Payer: Self-pay

## 2021-05-08 ENCOUNTER — Ambulatory Visit (INDEPENDENT_AMBULATORY_CARE_PROVIDER_SITE_OTHER): Payer: Medicare Other | Admitting: Otolaryngology

## 2021-05-08 DIAGNOSIS — H9072 Mixed conductive and sensorineural hearing loss, unilateral, left ear, with unrestricted hearing on the contralateral side: Secondary | ICD-10-CM

## 2021-05-08 DIAGNOSIS — H9122 Sudden idiopathic hearing loss, left ear: Secondary | ICD-10-CM

## 2021-05-08 MED ORDER — TRIAMCINOLONE ACETONIDE 55 MCG/ACT NA AERO: 2.0000 | INHALATION_SPRAY | Freq: Every day | NASAL | 12 refills | Status: DC

## 2021-05-08 NOTE — Progress Notes (Signed)
HPI: Suzanne Morris is a 80 y.o. female who presents is referred by her PCP for evaluation of "stopped up left ear".  This occurred about 2 months ago while she was down in [facility].  She went to bed with essentially normal hearing and woke up the next morning with the left ear feeling stopped up.  She was seen by her local physician and had the ear cleaned and irrigated but minimal wax came out.  She still had the "stopped up feeling in the left ear.  Sometimes she feels like she hears something moving or popping in the left ear..  Past Medical History:  Diagnosis Date   Allergy    History of chickenpox    No past surgical history on file. Social History   Socioeconomic History   Marital status: Married    Spouse name: Not on file   Number of children: Not on file   Years of education: Not on file   Highest education level: Not on file  Occupational History   Occupation: Retired  Tobacco Use   Smoking status: Former    Packs/day: 0.25    Years: 2.00    Pack years: 0.50    Types: Cigarettes   Smokeless tobacco: Never  Vaping Use   Vaping Use: Never used  Substance and Sexual Activity   Alcohol use: Yes    Alcohol/week: 3.0 - 4.0 standard drinks    Types: 3 - 4 Glasses of wine per week   Drug use: Never   Sexual activity: Yes    Partners: Male  Other Topics Concern   Not on file  Social History Narrative   Not on file   Social Determinants of Health   Financial Resource Strain: Low Risk    Difficulty of Paying Living Expenses: Not hard at all  Food Insecurity: No Food Insecurity   Worried About Programme researcher, broadcasting/film/video in the Last Year: Never true   Ran Out of Food in the Last Year: Never true  Transportation Needs: No Transportation Needs   Lack of Transportation (Medical): No   Lack of Transportation (Non-Medical): No  Physical Activity: Sufficiently Active   Days of Exercise per Week: 5 days   Minutes of Exercise per Session: 50 min  Stress: No Stress Concern  Present   Feeling of Stress : Not at all  Social Connections: Socially Integrated   Frequency of Communication with Friends and Family: More than three times a week   Frequency of Social Gatherings with Friends and Family: More than three times a week   Attends Religious Services: More than 4 times per year   Active Member of [employer] or Organizations: Yes   Attends Banker Meetings: 1 to 4 times per year   Marital Status: Married   Family History  Problem Relation Age of Onset   Hypertension Mother    Kidney disease Son        underdeveloped kidneys, transplant   Other Father        rheumatic fever   COPD Father        smoker   Heart failure Father    No Known Allergies Prior to Admission medications   Medication Sig Start Date End Date Taking? Authorizing Provider  clonazePAM (KLONOPIN) 0.5 MG tablet Take 0.5 tablets (0.25 mg total) by mouth daily as needed for anxiety. 10/31/20   Wynn Banker, MD  Coenzyme Q10 (COQ10 PO) Take by mouth daily.    [provider]  Multiple Vitamins-Minerals (MULTIVITAMIN ADULT) CHEW Chew by mouth.    [provider]  rosuvastatin (CRESTOR) 5 MG tablet Take 1 tablet (5 mg total) by mouth at bedtime. 10/31/20   Wynn Banker, MD     Positive ROS: Otherwise negative  All other systems have been reviewed and were otherwise negative with the exception of those mentioned in the HPI and as above.  Physical Exam: Constitutional: Alert, well-appearing, no acute distress Ears: External ears without lesions or tenderness. Ear canals are clear bilaterally with no wax buildup.  Both TMs appear clear with good mobility on pneumatic otoscopy.  I was able to insufflate some air behind the TMs without any significant change in her hearing.  On hearing screening with a tuning forks she heard much better in the right ear compared to the left Weber actually lateralized to the right.  However with the 512 tuning fork she felt  like she heard the bone-conduction better in the left ear than they are conductive and AC > BC on the right side. Nasal: External nose without lesions. Septum with minimal deformity and mild rhinitis both middle meatus regions were clear.. Oral: Lips and gums without lesions. Tongue and palate mucosa without lesions. Posterior oropharynx clear. Neck: No palpable adenopathy or masses Respiratory: Breathing comfortably  Skin: No facial/neck lesions or rash noted.  Procedures  Assessment: Sudden left ear hearing loss 2 months ago.  Plan: Placed her on Nasacort 2 sprays each nostril at night. We will have her follow-up in 10 days to 2 weeks for recheck and audiologic testing as she was not scheduled for hearing test today.    Narda Bonds, MD   CC:

## 2021-05-22 ENCOUNTER — Ambulatory Visit (INDEPENDENT_AMBULATORY_CARE_PROVIDER_SITE_OTHER): Payer: Medicare Other | Admitting: Otolaryngology

## 2021-05-22 ENCOUNTER — Other Ambulatory Visit: Payer: Self-pay

## 2021-05-22 DIAGNOSIS — H9122 Sudden idiopathic hearing loss, left ear: Secondary | ICD-10-CM

## 2021-05-22 NOTE — Progress Notes (Signed)
HPI: Suzanne Morris is a 80 y.o. female who returns today for evaluation of sudden left ear sensorineural hearing loss that occurred a little over 2 months ago.  She still notices occasional popping and sensation of fluid in the left ear.  I had prescribed Nasacort and she felt like this helped some with the symptoms.  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  SRT's were 85 dB on the left and 5 dB on the right.  She had type A tympanograms bilaterally..  Past Medical History:  Diagnosis Date   Allergy    History of chickenpox    No past surgical history on file. Social History   Socioeconomic History   Marital status: Married    Spouse name: Not on file   Number of children: Not on file   Years of education: Not on file   Highest education level: Not on file  Occupational History   Occupation: Retired  Tobacco Use   Smoking status: Former    Packs/day: 0.25    Years: 2.00    Pack years: 0.50    Types: Cigarettes   Smokeless tobacco: Never  Vaping Use   Vaping Use: Never used  Substance and Sexual Activity   Alcohol use: Yes    Alcohol/week: 3.0 - 4.0 standard drinks    Types: 3 - 4 Glasses of wine per week   Drug use: Never   Sexual activity: Yes    Partners: Male  Other Topics Concern   Not on file  Social History Narrative   Not on file   Social Determinants of Health   Financial Resource Strain: Low Risk    Difficulty of Paying Living Expenses: Not hard at all  Food Insecurity: No Food Insecurity   Worried About Programme researcher, broadcasting/film/video in the Last Year: Never true   Ran Out of Food in the Last Year: Never true  Transportation Needs: No Transportation Needs   Lack of Transportation (Medical): No   Lack of Transportation (Non-Medical): No  Physical Activity: Sufficiently Active   Days of Exercise per Week: 5 days   Minutes of Exercise per  Session: 50 min  Stress: No Stress Concern Present   Feeling of Stress : Not at all  Social Connections: Socially Integrated   Frequency of Communication with Friends and Family: More than three times a week   Frequency of Social Gatherings with Friends and Family: More than three times a week   Attends Religious Services: More than 4 times per year   Active Member of [employer] or Organizations: Yes   Attends Banker Meetings: 1 to 4 times per year   Marital Status: Married   Family History  Problem Relation Age of Onset   Hypertension Mother    Kidney disease Son        underdeveloped kidneys, transplant   Other Father        rheumatic fever   COPD Father        smoker   Heart failure Father    No Known Allergies Prior to Admission medications   Medication Sig Start Date End Date Taking? Authorizing Provider  clonazePAM (KLONOPIN) 0.5 MG tablet Take 0.5 tablets (0.25 mg total) by mouth daily as needed for anxiety. 10/31/20   Wynn Banker, MD  Coenzyme Q10 (COQ10 PO) Take by mouth daily.    [provider]  Multiple Vitamins-Minerals (MULTIVITAMIN ADULT) CHEW Chew by mouth.    [provider]  rosuvastatin (CRESTOR) 5 MG tablet Take 1 tablet (5 mg total) by mouth at bedtime. 10/31/20   Wynn Banker, MD  triamcinolone (NASACORT) 55 MCG/ACT AERO nasal inhaler Place 2 sprays into the nose daily. 2 sprays each nostril at night 05/08/21   Drema Halon, MD     Positive ROS: Otherwise negative  All other systems have been reviewed and were otherwise negative with the exception of those mentioned in the HPI and as above.  Physical Exam: Constitutional: Alert, well-appearing, no acute distress Ears: External ears without lesions or tenderness. Ear canals are clear bilaterally with intact, clear TMs bilaterally with no obvious middle ear effusion noted. Nasal: External nose without lesions.  With minimal septal deformity and mild rhinitis..  Clear nasal passages otherwise. Oral: Lips and gums without lesions. Tongue and palate mucosa without lesions. Posterior oropharynx clear. Neck: No palpable adenopathy or masses Respiratory: Breathing comfortably  Skin: No facial/neck lesions or rash noted.  Procedures  Assessment: Sudden left ear sensorineural hearing loss that occurred over 2 months ago. Mild rhinitis.  Plan: Discussed with her concerning the sudden left ear sensorineural hearing loss.  The only treatment option for this would be hearing aid. Concerning use of the Nasacort she can use this as needed if it helps symptoms.  She can use it just in the left nostril since her symptoms are all left-sided. She will follow-up as needed   Narda Bonds, MD

## 2021-06-12 ENCOUNTER — Encounter: Payer: Self-pay | Admitting: Family Medicine

## 2021-06-12 DIAGNOSIS — H908 Mixed conductive and sensorineural hearing loss, unspecified: Secondary | ICD-10-CM

## 2021-06-12 DIAGNOSIS — H938X9 Other specified disorders of ear, unspecified ear: Secondary | ICD-10-CM

## 2021-07-15 ENCOUNTER — Ambulatory Visit (INDEPENDENT_AMBULATORY_CARE_PROVIDER_SITE_OTHER): Payer: Medicare Other

## 2021-07-15 VITALS — Ht 62.0 in | Wt 123.0 lb

## 2021-07-15 DIAGNOSIS — Z0000 Encounter for general adult medical examination without abnormal findings: Secondary | ICD-10-CM | POA: Diagnosis not present

## 2021-07-15 NOTE — Progress Notes (Signed)
I connected with Suzanne Morris today by telephone and verified that I am speaking with the correct person using two identifiers. Location patient: home Location provider: work Persons participating in the virtual visit: Suzanne Morris, Suzanne Ponder LPN.   I discussed the limitations, risks, security and privacy concerns of performing an evaluation and management service by telephone and the availability of in person appointments. I also discussed with the patient that there may be a patient responsible charge related to this service. The patient expressed understanding and verbally consented to this telephonic visit.    Interactive audio and video telecommunications were attempted between this provider and patient, however failed, due to patient having technical difficulties OR patient did not have access to video capability.  We continued and completed visit with audio only.     Vital signs may be patient reported or missing.  Subjective:   Suzanne Morris is a 80 y.o. female who presents for Medicare Annual (Subsequent) preventive examination.  Review of Systems     Cardiac Risk Factors include: advanced age (>38men, >68 women);hypertension     Objective:    Today's Vitals   07/15/21 0941  Weight: 123 lb (55.8 kg)  Height:  (1.575 m)   Body mass index is 22.5 kg/m.  Advanced Directives 07/15/2021 07/11/2020  Does Patient Have a Medical Advance Directive? Yes Yes  Type of Estate agent of [place];Living will -  Does patient want to make changes to medical advance directive? - No - Patient declined  Copy of Healthcare Power of Attorney in Chart? No - copy requested -    Current Medications (verified) Outpatient Encounter Medications as of 07/15/2021  Medication Sig   clonazePAM (KLONOPIN) 0.5 MG tablet Take 0.5 tablets (0.25 mg total) by mouth daily as needed for anxiety.   Coenzyme Q10 (COQ10 PO) Take by mouth daily.   Multiple Vitamins-Minerals  (MULTIVITAMIN ADULT) CHEW Chew by mouth.   rosuvastatin (CRESTOR) 5 MG tablet Take 1 tablet (5 mg total) by mouth at bedtime.   triamcinolone (NASACORT) 55 MCG/ACT AERO nasal inhaler Place 2 sprays into the nose daily. 2 sprays each nostril at night (Patient not taking: Reported on 07/15/2021)   No facility-administered encounter medications on file as of 07/15/2021.    Allergies (verified) Patient has no known allergies.   History: Past Medical History:  Diagnosis Date   Allergy    History of chickenpox    History reviewed. No pertinent surgical history. Family History  Problem Relation Age of Onset   Hypertension Mother    Kidney disease Son        underdeveloped kidneys, transplant   Other Father        rheumatic fever   COPD Father        smoker   Heart failure Father    Social History   Socioeconomic History   Marital status: Married    Spouse name: Not on file   Number of children: Not on file   Years of education: Not on file   Highest education level: Not on file  Occupational History   Occupation: Retired  Tobacco Use   Smoking status: Former    Packs/day: 0.25    Years: 2.00    Pack years: 0.50    Types: Cigarettes   Smokeless tobacco: Never  Vaping Use   Vaping Use: Never used  Substance and Sexual Activity   Alcohol use: Yes    Alcohol/week: 3.0 - 4.0 standard drinks    Types:  3 - 4 Glasses of wine per week   Drug use: Never   Sexual activity: Yes    Partners: Male  Other Topics Concern   Not on file  Social History Narrative   Not on file   Social Determinants of Health   Financial Resource Strain: Low Risk    Difficulty of Paying Living Expenses: Not hard at all  Food Insecurity: No Food Insecurity   Worried About Programme researcher, broadcasting/film/video in the Last Year: Never true   Ran Out of Food in the Last Year: Never true  Transportation Needs: No Transportation Needs   Lack of Transportation (Medical): No   Lack of Transportation (Non-Medical): No   Physical Activity: Sufficiently Active   Days of Exercise per Week: 4 days   Minutes of Exercise per Session: 50 min  Stress: No Stress Concern Present   Feeling of Stress : Only a little  Social Connections: Not on file    Tobacco Counseling Counseling given: Not Answered   Clinical Intake:  Pre-visit preparation completed: Yes  Pain : No/denies pain     Nutritional Status: BMI of 19-24  Normal Nutritional Risks: None Diabetes: No  How often do you need to have someone help you when you read instructions, pamphlets, or other written materials from your doctor or pharmacy?: 1 - Never What is the last grade level you completed in school?: 2 1/2 yrs college  Diabetic? no  Interpreter Needed?: No  Information entered by :: NAllen LPN   Activities of Daily Living In your present state of health, do you have any difficulty performing the following activities: 07/15/2021  Hearing? Y  Comment left ear decreased hearing  Vision? N  Difficulty concentrating or making decisions? N  Walking or climbing stairs? N  Dressing or bathing? N  Doing errands, shopping? N  Preparing Food and eating ? N  Using the Toilet? N  In the past six months, have you accidently leaked urine? N  Do you have problems with loss of bowel control? N  Managing your Medications? N  Managing your Finances? N  Housekeeping or managing your Housekeeping? N  Some recent data might be hidden    Patient Care Team: Wynn Banker, MD as PCP - General (Family Medicine)  Indicate any recent Medical Services you may have received from other than [facility] providers in the past year (date may be approximate).     Assessment:   This is a routine wellness examination for [name].  Hearing/Vision screen Vision Screening - Comments:: Regular eye exams, [facility]  Dietary issues and exercise activities discussed: Current Exercise Habits: Home exercise routine, Type of exercise: walking, Frequency  (Times/Week): 4   Goals Addressed             This Visit's Progress    Patient Stated       07/15/2021, maintain health       Depression Screen PHQ 2/9 Scores 07/15/2021 02/06/2021 07/11/2020  PHQ - 2 Score 0 0 0  PHQ- 9 Score - 2 -    Fall Risk Fall Risk  07/15/2021 02/06/2021 07/11/2020  Falls in the past year? 0 0 0  Number falls in past yr: - 0 0  Injury with Fall? - - 0  Risk for fall due to : Medication side effect - Impaired vision  Follow up Falls evaluation completed;Education provided;Falls prevention discussed - Falls prevention discussed    FALL RISK PREVENTION PERTAINING TO THE HOME:  Any stairs in or  around the home? Yes  If so, are there any without handrails? No  Home free of loose throw rugs in walkways, pet beds, electrical cords, etc? Yes  Adequate lighting in your home to reduce risk of falls? Yes   ASSISTIVE DEVICES UTILIZED TO PREVENT FALLS:  Life alert? No  Use of a cane, walker or w/c? No  Grab bars in the bathroom? Yes  Shower chair or bench in shower? No  Elevated toilet seat or a handicapped toilet? No   TIMED UP AND GO:  Was the test performed? No .      Cognitive Function:     6CIT Screen 07/15/2021 07/11/2020  What Year? 0 points 0 points  What month? 0 points 0 points  What time? 0 points -  Count back from 20 0 points 0 points  Months in reverse 0 points 0 points  Repeat phrase 0 points 0 points  Total Score 0 -    Immunizations Immunization History  Administered Date(s) Administered   Fluad Quad(high Dose 65+) 07/10/2021   PFIZER(Purple Top)SARS-COV-2 Vaccination 09/05/2019, 10/03/2019, 07/30/2020   PNEUMOCOCCAL CONJUGATE-20 03/04/2021   Zoster Recombinat (Shingrix) 08/10/2020    TDAP status: Due, Education has been provided regarding the importance of this vaccine. Advised may receive this vaccine at local pharmacy or Health Dept. Aware to provide a copy of the vaccination record if obtained from local pharmacy or  Health Dept. Verbalized acceptance and understanding.  Flu Vaccine status: Up to date  Pneumococcal vaccine status: Up to date  Covid-19 vaccine status: Completed vaccines  Qualifies for Shingles Vaccine? Yes   Zostavax completed No   Shingrix Completed?: need second dose  Screening Tests Health Maintenance  Topic Date Due   TETANUS/TDAP  Never done   COVID-19 Vaccine (4 - Booster for Pfizer series) 09/24/2020   Zoster Vaccines- Shingrix (2 of 2) 10/05/2020   Pneumonia Vaccine 26+ Years old  Completed   INFLUENZA VACCINE  Completed   DEXA SCAN  Completed   HPV VACCINES  Aged Out    Health Maintenance  Health Maintenance Due  Topic Date Due   TETANUS/TDAP  Never done   COVID-19 Vaccine (4 - Booster for Pfizer series) 09/24/2020   Zoster Vaccines- Shingrix (2 of 2) 10/05/2020    Colorectal cancer screening: No longer required.   Mammogram status: No longer required due to age.  Bone Density status: Completed 04/02/2021.  Lung Cancer Screening: (Low Dose CT Chest recommended if Age 76-80 years, 30 pack-year currently smoking OR have quit w/in 15years.) does not qualify.   Lung Cancer Screening Referral: no  Additional Screening:  Hepatitis C Screening: does not qualify;   Vision Screening: Recommended annual ophthalmology exams for early detection of glaucoma and other disorders of the eye. Is the patient up to date with their annual eye exam?  Yes  Who is the provider or what is the name of the office in which the patient attends annual eye exams? [facility] If pt is not established with a provider, would they like to be referred to a provider to establish care? No .   Dental Screening: Recommended annual dental exams for proper oral hygiene  Community Resource Referral / Chronic Care Management: CRR required this visit?  No   CCM required this visit?  No      Plan:     I have personally reviewed and noted the following in the patient's chart:    Medical and social history Use of alcohol, tobacco or illicit  drugs  Current medications and supplements including opioid prescriptions.  Functional ability and status Nutritional status Physical activity Advanced directives List of other physicians Hospitalizations, surgeries, and ER visits in previous 12 months Vitals Screenings to include cognitive, depression, and falls Referrals and appointments  In addition, I have reviewed and discussed with patient certain preventive protocols, quality metrics, and best practice recommendations. A written personalized care plan for preventive services as well as general preventive health recommendations were provided to patient.     Barb Merino, LPN   [id number]   Nurse Notes: none

## 2021-07-15 NOTE — Patient Instructions (Signed)
Ms. Suzanne Morris , Thank you for taking time to come for your Medicare Wellness Visit. I appreciate your ongoing commitment to your health goals. Please review the following plan we discussed and let me know if I can assist you in the future.   Screening recommendations/referrals: Colonoscopy: not required Mammogram: not required Bone Density: completed 04/02/2021 Recommended yearly ophthalmology/optometry visit for glaucoma screening and checkup Recommended yearly dental visit for hygiene and checkup  Vaccinations: Influenza vaccine: completed 07/10/2021 Pneumococcal vaccine: completed 03/04/2021 Tdap vaccine: due Shingles vaccine: discussed   Covid-19: 07/30/2020, 10/03/2019, 09/05/2019  Advanced directives: Please bring a copy of your POA (Power of Attorney) and/or Living Will to your next appointment.   Conditions/risks identified: none  Next appointment: Follow up in one year for your annual wellness visit    Preventive Care 65 Years and Older, Female Preventive care refers to lifestyle choices and visits with your health care provider that can promote health and wellness. What does preventive care include? A yearly physical exam. This is also called an annual well check. Dental exams once or twice a year. Routine eye exams. Ask your health care provider how often you should have your eyes checked. Personal lifestyle choices, including: Daily care of your teeth and gums. Regular physical activity. Eating a healthy diet. Avoiding tobacco and drug use. Limiting alcohol use. Practicing safe sex. Taking low-dose aspirin every day. Taking vitamin and mineral supplements as recommended by your health care provider. What happens during an annual well check? The services and screenings done by your health care provider during your annual well check will depend on your age, overall health, lifestyle risk factors, and family history of disease. Counseling  Your health care provider may ask you  questions about your: Alcohol use. Tobacco use. Drug use. Emotional well-being. Home and relationship well-being. Sexual activity. Eating habits. History of falls. Memory and ability to understand (cognition). Work and work Astronomer. Reproductive health. Screening  You may have the following tests or measurements: Height, weight, and BMI. Blood pressure. Lipid and cholesterol levels. These may be checked every 5 years, or more frequently if you are over 66 years old. Skin check. Lung cancer screening. You may have this screening every year starting at age 14 if you have a 30-pack-year history of smoking and currently smoke or have quit within the past 15 years. Fecal occult blood test (FOBT) of the stool. You may have this test every year starting at age 74. Flexible sigmoidoscopy or colonoscopy. You may have a sigmoidoscopy every 5 years or a colonoscopy every 10 years starting at age 47. Hepatitis C blood test. Hepatitis B blood test. Sexually transmitted disease (STD) testing. Diabetes screening. This is done by checking your blood sugar (glucose) after you have not eaten for a while (fasting). You may have this done every 1-3 years. Bone density scan. This is done to screen for osteoporosis. You may have this done starting at age 48. Mammogram. This may be done every 1-2 years. Talk to your health care provider about how often you should have regular mammograms. Talk with your health care provider about your test results, treatment options, and if necessary, the need for more tests. Vaccines  Your health care provider may recommend certain vaccines, such as: Influenza vaccine. This is recommended every year. Tetanus, diphtheria, and acellular pertussis (Tdap, Td) vaccine. You may need a Td booster every 10 years. Zoster vaccine. You may need this after age 67. Pneumococcal 13-valent conjugate (PCV13) vaccine. One dose is recommended after  age 60. Pneumococcal polysaccharide  (PPSV23) vaccine. One dose is recommended after age 66. Talk to your health care provider about which screenings and vaccines you need and how often you need them. This information is not intended to replace advice given to you by your health care provider. Make sure you discuss any questions you have with your health care provider. Document Released: 08/17/2015 Document Revised: 04/09/2016 Document Reviewed: 05/22/2015 Elsevier Interactive Patient Education  2017 [place] Prevention in the Home Falls can cause injuries. They can happen to people of all ages. There are many things you can do to make your home safe and to help prevent falls. What can I do on the outside of my home? Regularly fix the edges of walkways and driveways and fix any cracks. Remove anything that might make you trip as you walk through a door, such as a raised step or threshold. Trim any bushes or trees on the path to your home. Use bright outdoor lighting. Clear any walking paths of anything that might make someone trip, such as rocks or tools. Regularly check to see if handrails are loose or broken. Make sure that both sides of any steps have handrails. Any raised decks and porches should have guardrails on the edges. Have any leaves, snow, or ice cleared regularly. Use sand or salt on walking paths during winter. Clean up any spills in your garage right away. This includes oil or grease spills. What can I do in the bathroom? Use night lights. Install grab bars by the toilet and in the tub and shower. Do not use towel bars as grab bars. Use non-skid mats or decals in the tub or shower. If you need to sit down in the shower, use a plastic, non-slip stool. Keep the floor dry. Clean up any water that spills on the floor as soon as it happens. Remove soap buildup in the tub or shower regularly. Attach bath mats securely with double-sided non-slip rug tape. Do not have throw rugs and other things on the  floor that can make you trip. What can I do in the bedroom? Use night lights. Make sure that you have a light by your bed that is easy to reach. Do not use any sheets or blankets that are too big for your bed. They should not hang down onto the floor. Have a firm chair that has side arms. You can use this for support while you get dressed. Do not have throw rugs and other things on the floor that can make you trip. What can I do in the kitchen? Clean up any spills right away. Avoid walking on wet floors. Keep items that you use a lot in easy-to-reach places. If you need to reach something above you, use a strong step stool that has a grab bar. Keep electrical cords out of the way. Do not use floor polish or wax that makes floors slippery. If you must use wax, use non-skid floor wax. Do not have throw rugs and other things on the floor that can make you trip. What can I do with my stairs? Do not leave any items on the stairs. Make sure that there are handrails on both sides of the stairs and use them. Fix handrails that are broken or loose. Make sure that handrails are as long as the stairways. Check any carpeting to make sure that it is firmly attached to the stairs. Fix any carpet that is loose or worn. Avoid having throw rugs  at the top or bottom of the stairs. If you do have throw rugs, attach them to the floor with carpet tape. Make sure that you have a light switch at the top of the stairs and the bottom of the stairs. If you do not have them, ask someone to add them for you. What else can I do to help prevent falls? Wear shoes that: Do not have high heels. Have rubber bottoms. Are comfortable and fit you well. Are closed at the toe. Do not wear sandals. If you use a stepladder: Make sure that it is fully opened. Do not climb a closed stepladder. Make sure that both sides of the stepladder are locked into place. Ask someone to hold it for you, if possible. Clearly mark and make  sure that you can see: Any grab bars or handrails. First and last steps. Where the edge of each step is. Use tools that help you move around (mobility aids) if they are needed. These include: Canes. Walkers. Scooters. Crutches. Turn on the lights when you go into a dark area. Replace any light bulbs as soon as they burn out. Set up your furniture so you have a clear path. Avoid moving your furniture around. If any of your floors are uneven, fix them. If there are any pets around you, be aware of where they are. Review your medicines with your doctor. Some medicines can make you feel dizzy. This can increase your chance of falling. Ask your doctor what other things that you can do to help prevent falls. This information is not intended to replace advice given to you by your health care provider. Make sure you discuss any questions you have with your health care provider. Document Released: 05/17/2009 Document Revised: 12/27/2015 Document Reviewed: 08/25/2014 Elsevier Interactive Patient Education  2017 [employer].

## 2021-07-17 ENCOUNTER — Ambulatory Visit: Payer: Medicare Other

## 2021-08-21 ENCOUNTER — Ambulatory Visit (INDEPENDENT_AMBULATORY_CARE_PROVIDER_SITE_OTHER): Payer: Medicare Other | Admitting: Family Medicine

## 2021-08-21 ENCOUNTER — Encounter: Payer: Self-pay | Admitting: Family Medicine

## 2021-08-21 VITALS — BP 168/80 | HR 108 | Temp 98.3°F | Wt 127.6 lb

## 2021-08-21 DIAGNOSIS — R7301 Impaired fasting glucose: Secondary | ICD-10-CM

## 2021-08-21 DIAGNOSIS — H6982 Other specified disorders of Eustachian tube, left ear: Secondary | ICD-10-CM

## 2021-08-21 DIAGNOSIS — E785 Hyperlipidemia, unspecified: Secondary | ICD-10-CM

## 2021-08-21 DIAGNOSIS — I10 Essential (primary) hypertension: Secondary | ICD-10-CM | POA: Diagnosis not present

## 2021-08-21 LAB — CBC WITH DIFFERENTIAL/PLATELET
Basophils Absolute: 0 10*3/uL (ref 0.0–0.1)
Basophils Relative: 0.6 % (ref 0.0–3.0)
Eosinophils Absolute: 0.2 10*3/uL (ref 0.0–0.7)
Eosinophils Relative: 2.4 % (ref 0.0–5.0)
HCT: 43.3 % (ref 36.0–46.0)
Hemoglobin: 14.3 g/dL (ref 12.0–15.0)
Lymphocytes Relative: 31.8 % (ref 12.0–46.0)
Lymphs Abs: 2.3 10*3/uL (ref 0.7–4.0)
MCHC: 33 g/dL (ref 30.0–36.0)
MCV: 91.1 fl (ref 78.0–100.0)
Monocytes Absolute: 0.5 10*3/uL (ref 0.1–1.0)
Monocytes Relative: 6.3 % (ref 3.0–12.0)
Neutro Abs: 4.3 10*3/uL (ref 1.4–7.7)
Neutrophils Relative %: 58.9 % (ref 43.0–77.0)
Platelets: 233 10*3/uL (ref 150.0–400.0)
RBC: 4.75 Mil/uL (ref 3.87–5.11)
RDW: 12.9 % (ref 11.5–15.5)
WBC: 7.3 10*3/uL (ref 4.0–10.5)

## 2021-08-21 LAB — COMPREHENSIVE METABOLIC PANEL
ALT: 25 U/L (ref 0–35)
AST: 24 U/L (ref 0–37)
Albumin: 4.4 g/dL (ref 3.5–5.2)
Alkaline Phosphatase: 72 U/L (ref 39–117)
BUN: 21 mg/dL (ref 6–23)
CO2: 30 mEq/L (ref 19–32)
Calcium: 9.6 mg/dL (ref 8.4–10.5)
Chloride: 104 mEq/L (ref 96–112)
Creatinine, Ser: 0.9 mg/dL (ref 0.40–1.20)
GFR: 60.32 mL/min (ref >60.00)
Glucose, Bld: 109 mg/dL — ABNORMAL HIGH (ref 70–99)
Potassium: 4.1 mEq/L (ref 3.5–5.1)
Sodium: 139 mEq/L (ref 135–145)
Total Bilirubin: 0.6 mg/dL (ref 0.2–1.2)
Total Protein: 7.2 g/dL (ref 6.0–8.3)

## 2021-08-21 LAB — LIPID PANEL
Cholesterol: 154 mg/dL (ref 0–200)
HDL: 52.5 mg/dL (ref >39.00)
LDL Cholesterol: 79 mg/dL (ref 0–99)
NonHDL: 101.7
Total CHOL/HDL Ratio: 3
Triglycerides: 113 mg/dL (ref 0.0–149.0)
VLDL: 22.6 mg/dL (ref 0.0–40.0)

## 2021-08-21 LAB — HEMOGLOBIN A1C: Hgb A1c MFr Bld: 5.7 % (ref 4.6–6.5)

## 2021-08-21 MED ORDER — VITAMIN D3 50 MCG (2000 UT) PO CAPS: 2000.0000 [IU] | ORAL_CAPSULE | Freq: Every day | ORAL | Status: AC

## 2021-08-21 MED ORDER — CETIRIZINE HCL 10 MG PO TABS: 10.0000 mg | ORAL_TABLET | Freq: Every day | ORAL | 2 refills | Status: DC

## 2021-08-21 NOTE — Progress Notes (Signed)
Suzanne Morris DOB: June 12, 1941 Encounter date: 08/21/2021  This is a 81 y.o. female who presents with Chief Complaint  Patient presents with   Follow-up    1mo f/u for chronic conditions    History of present illness: Last visit with me was 02/06/2021.  Hyperlipidemia: Crestor 5 mg daily Anxiety: Klonopin 0.25 mg daily as needed anxiety.   Only issues she is having is ear. About a week after she left here last, she was on vacation and woke up and ear was plugged. She ended up contacting ENT who had her start nasocort. She checked with eye doc and told her due to cataracts to not use nasocort more than a week. She tried nasocort again at advice of ent and then started to note filminess of eyes. She did try decongestants otc for a couple of weeks, then tried benadryl (but this makes her really drowsy); these didn't really help. Hearing left side is muffled. Touching ear helps it clear for a few seconds. Probably only a couple of nights of benadryl. Ear has not been painful. No dizziness.   Occasionally will get left sided nose bleed. Doesn't last and not frequent.   HTN: bp yesterday at home was 127/67.     No Known Allergies Current Meds  Medication Sig   cetirizine (ZYRTEC) 10 MG tablet Take 1 tablet (10 mg total) by mouth daily.   Cholecalciferol (VITAMIN D3) 50 MCG (2000 UT) capsule Take 1 capsule (2,000 Units total) by mouth daily.   clonazePAM (KLONOPIN) 0.5 MG tablet Take 0.5 tablets (0.25 mg total) by mouth daily as needed for anxiety.   Coenzyme Q10 (COQ10 PO) Take by mouth daily.   Multiple Vitamins-Minerals (MULTIVITAMIN ADULT) CHEW Chew by mouth.   rosuvastatin (CRESTOR) 5 MG tablet Take 1 tablet (5 mg total) by mouth at bedtime.    Review of Systems  Constitutional:  Negative for chills, fatigue and fever.  Respiratory:  Negative for cough, chest tightness, shortness of breath and wheezing.   Cardiovascular:  Negative for chest pain, palpitations and leg swelling.    Objective:  BP (!) 168/80 (BP Location: Right Arm, Patient Position: Sitting, Cuff Size: Normal)    Pulse (!) 108    Temp 98.3 F (36.8 C) (Oral)    Wt 127 lb 9.6 oz (57.9 kg)    SpO2 98%    BMI 23.34 kg/m   Weight: 127 lb 9.6 oz (57.9 kg)   BP Readings from Last 3 Encounters:  08/21/21 (!) 168/80  03/28/21 (!) 100/100  02/06/21 130/70   Wt Readings from Last 3 Encounters:  08/21/21 127 lb 9.6 oz (57.9 kg)  07/15/21 123 lb (55.8 kg)  03/28/21 123 lb 6.4 oz (56 kg)    Physical Exam Constitutional:      General: She is not in acute distress.    Appearance: She is well-developed.  Cardiovascular:     Rate and Rhythm: Normal rate and regular rhythm.     Heart sounds: Normal heart sounds. No murmur heard.   No friction rub.  Pulmonary:     Effort: Pulmonary effort is normal. No respiratory distress.     Breath sounds: Normal breath sounds. No wheezing or rales.  Musculoskeletal:     Right lower leg: No edema.     Left lower leg: No edema.  Neurological:     Mental Status: She is alert and oriented to person, place, and time.  Psychiatric:        Behavior: Behavior normal.  Assessment/Plan  1. Dysfunction of left eustachian tube Trial of zyrtec until she sees ENT.  - cetirizine (ZYRTEC) 10 MG tablet; Take 1 tablet (10 mg total) by mouth daily.  Dispense: 30 tablet; Refill: 2  2. Hypertension, unspecified type Blood pressures at home are good. She has white chart hypertension.  - CBC with Differential/Platelet; Future - Comprehensive metabolic panel; Future  3. Hyperlipidemia, unspecified hyperlipidemia type Crestor is well tolerated. Will recheck lipidstoday.  - Lipid panel; Future  4. Impaired fasting glucose - Hemoglobin A1c; Future   Return in about 6 months (around 02/18/2022) for physical exam. She will get second shingrix at pharmacy and Tdap.     Theodis Shove, MD

## 2021-08-21 NOTE — Patient Instructions (Addendum)
A consideration would also be atrovent nasal spray to help with opening up eustachian tube, but I would suggest trying the zyrtec first and then seeing what Dr. Suszanne Conners says.   Consider Tdap at the pharmacy when you get your second shingles shot.

## 2021-09-25 ENCOUNTER — Other Ambulatory Visit: Payer: Self-pay | Admitting: Family Medicine

## 2021-11-25 ENCOUNTER — Other Ambulatory Visit: Payer: Self-pay | Admitting: Otolaryngology

## 2021-11-25 DIAGNOSIS — H903 Sensorineural hearing loss, bilateral: Secondary | ICD-10-CM

## 2022-03-12 ENCOUNTER — Other Ambulatory Visit: Payer: Self-pay | Admitting: *Deleted

## 2022-03-12 MED ORDER — ROSUVASTATIN CALCIUM 5 MG PO TABS: 5.0000 mg | ORAL_TABLET | Freq: Every day | ORAL | 0 refills | Status: DC

## 2022-03-12 NOTE — Telephone Encounter (Signed)
Needs appt

## 2022-06-04 ENCOUNTER — Ambulatory Visit (INDEPENDENT_AMBULATORY_CARE_PROVIDER_SITE_OTHER): Payer: Medicare Other | Admitting: Family Medicine

## 2022-06-04 ENCOUNTER — Encounter: Payer: Self-pay | Admitting: Family Medicine

## 2022-06-04 ENCOUNTER — Other Ambulatory Visit: Payer: Self-pay | Admitting: Family Medicine

## 2022-06-04 VITALS — BP 170/100 | HR 105 | Temp 98.5°F | Ht 62.0 in | Wt 128.2 lb

## 2022-06-04 DIAGNOSIS — E782 Mixed hyperlipidemia: Secondary | ICD-10-CM

## 2022-06-04 DIAGNOSIS — R739 Hyperglycemia, unspecified: Secondary | ICD-10-CM

## 2022-06-04 DIAGNOSIS — F419 Anxiety disorder, unspecified: Secondary | ICD-10-CM

## 2022-06-04 DIAGNOSIS — I10 Essential (primary) hypertension: Secondary | ICD-10-CM | POA: Diagnosis not present

## 2022-06-04 MED ORDER — CLONAZEPAM 0.5 MG PO TABS: 0.2500 mg | ORAL_TABLET | Freq: Every day | ORAL | 0 refills | Status: DC | PRN

## 2022-06-04 MED ORDER — ROSUVASTATIN CALCIUM 5 MG PO TABS: 5.0000 mg | ORAL_TABLET | Freq: Every day | ORAL | 3 refills | Status: DC

## 2022-06-04 NOTE — Progress Notes (Unsigned)
Established Patient Office Visit  Subjective   Patient ID: Suzanne Morris, female    DOB: [birth year]  Age: 81 y.o. MRN: [id number]  Chief Complaint  Patient presents with  . Establish Care    Patient is here for transition of care visit. She has no new symptoms or issues to report.  HTN-- patient reports that her blood pressure is always high when she comes to the doctor. States that at home her BP is normal. Is not currently on any medications. States that she checks her BP every day and it is actually sometimes low. She states that her husband is going through some medical issues and she is very nervous for him. States that her nervousness and anxiety causes her BP to elevate.   Anxiety- patient only take the clonazepam, about 3-4 times a year when her stress level gets very high. Last rx that was filled was on March 2022 and was for 20 tablets. She breaks them in half and uses them just as needed. She would like a refill.   Current Outpatient Medications  Medication Instructions  . clonazePAM (KLONOPIN) 0.25 mg, Oral, Daily PRN  . Coenzyme Q10 (COQ10 PO) Oral, Daily  . Multiple Vitamins-Minerals (MULTIVITAMIN ADULT) CHEW Oral  . rosuvastatin (CRESTOR) 5 mg, Oral, Daily at bedtime  . Vitamin D3 2,000 Units, Oral, Daily    Patient Active Problem List   Diagnosis Date Noted  . Hyperlipidemia 08/21/2021  . White coat syndrome with diagnosis of hypertension 11/01/2020  . Moderate aortic regurgitation 11/01/2020      Review of Systems  All other systems reviewed and are negative.     Objective:     BP (!) 170/100 (BP Location: Right Arm, Cuff Size: Normal)   Pulse (!) 105   Temp 98.5 F (36.9 C) (Oral)   Ht 5\' 2"  (1.575 m)   Wt 128 lb 3.2 oz (58.2 kg)   LMP  (Exact Date)   SpO2 100%   BMI 23.45 kg/m  {Vitals History (Optional):23777}  Physical Exam Vitals reviewed.  Constitutional:      Appearance: Normal appearance. She is well-groomed and normal weight.   Eyes:     Conjunctiva/sclera: Conjunctivae normal.  Neck:     Thyroid: No thyromegaly.  Cardiovascular:     Rate and Rhythm: Normal rate and regular rhythm.     Pulses: Normal pulses.     Heart sounds: S1 normal and S2 normal.  Pulmonary:     Effort: Pulmonary effort is normal.     Breath sounds: Normal breath sounds and air entry.  Abdominal:     General: Bowel sounds are normal.  Musculoskeletal:     Right lower leg: No edema.     Left lower leg: No edema.  Neurological:     Mental Status: She is alert and oriented to person, place, and time. Mental status is at baseline.     Gait: Gait is intact.  Psychiatric:        Mood and Affect: Mood and affect normal.        Speech: Speech normal.        Behavior: Behavior normal.        Judgment: Judgment normal.     No results found for any visits on 06/04/22.  {Labs (Optional):23779}  The ASCVD Risk score (Arnett DK, et al., 2019) failed to calculate for the following reasons:   The 2019 ASCVD risk score is only valid for ages 17 to 16  Assessment & Plan:   Problem List Items Addressed This Visit       Cardiovascular and Mediastinum   White coat syndrome with diagnosis of hypertension   Relevant Medications   rosuvastatin (CRESTOR) 5 MG tablet     Other   Hyperlipidemia - Primary   Relevant Medications   rosuvastatin (CRESTOR) 5 MG tablet    No follow-ups on file.    Farrel Conners, MD

## 2022-06-05 NOTE — Assessment & Plan Note (Signed)
Not on any medication, pt reports this as white coat HTN, states that her BP at home is normal and she is checking it regularly every week. I discussed with her the symptoms of high blood pressure and she should contact me and we can start medication.

## 2022-07-16 ENCOUNTER — Ambulatory Visit: Payer: Medicare Other

## 2022-07-21 ENCOUNTER — Ambulatory Visit: Payer: Medicare Other

## 2022-07-30 ENCOUNTER — Ambulatory Visit: Payer: Medicare Other

## 2022-08-11 ENCOUNTER — Ambulatory Visit (INDEPENDENT_AMBULATORY_CARE_PROVIDER_SITE_OTHER): Payer: Medicare Other

## 2022-08-11 VITALS — Ht 62.0 in | Wt 128.0 lb

## 2022-08-11 DIAGNOSIS — Z0000 Encounter for general adult medical examination without abnormal findings: Secondary | ICD-10-CM

## 2022-08-11 NOTE — Patient Instructions (Addendum)
Suzanne Morris , Thank you for taking time to come for your Medicare Wellness Visit. I appreciate your ongoing commitment to your health goals. Please review the following plan we discussed and let me know if I can assist you in the future.   These are the goals we discussed:  Goals       Patient Stated      Maintain health      Patient Stated      07/15/2021, maintain health      Stay Active (pt-stated)      Stay Healthy.        This is a list of the screening recommended for you and due dates:  Health Maintenance  Topic Date Due   DTaP/Tdap/Td vaccine (1 - Tdap) Never done   COVID-19 Vaccine (4 - 2023-24 season) 08/27/2022*   Zoster (Shingles) Vaccine (2 of 2) 09/04/2022*   Flu Shot  11/03/2022*   Medicare Annual Wellness Visit  08/12/2023   Pneumonia Vaccine  Completed   DEXA scan (bone density measurement)  Completed   HPV Vaccine  Aged Out  *Topic was postponed. The date shown is not the original due date.    Advanced directives: Please bring a copy of your health care power of attorney and living will to the office to be added to your chart at your convenience.   Conditions/risks identified: None  Next appointment: Follow up in one year for your annual wellness visit     Preventive Care 65 Years and Older, Female Preventive care refers to lifestyle choices and visits with your health care provider that can promote health and wellness. What does preventive care include? A yearly physical exam. This is also called an annual well check. Dental exams once or twice a year. Routine eye exams. Ask your health care provider how often you should have your eyes checked. Personal lifestyle choices, including: Daily care of your teeth and gums. Regular physical activity. Eating a healthy diet. Avoiding tobacco and drug use. Limiting alcohol use. Practicing safe sex. Taking low-dose aspirin every day. Taking vitamin and mineral supplements as recommended by your health care  provider. What happens during an annual well check? The services and screenings done by your health care provider during your annual well check will depend on your age, overall health, lifestyle risk factors, and family history of disease. Counseling  Your health care provider may ask you questions about your: Alcohol use. Tobacco use. Drug use. Emotional well-being. Home and relationship well-being. Sexual activity. Eating habits. History of falls. Memory and ability to understand (cognition). Work and work Statistician. Reproductive health. Screening  You may have the following tests or measurements: Height, weight, and BMI. Blood pressure. Lipid and cholesterol levels. These may be checked every 5 years, or more frequently if you are over 64 years old. Skin check. Lung cancer screening. You may have this screening every year starting at age 13 if you have a 30-pack-year history of smoking and currently smoke or have quit within the past 15 years. Fecal occult blood test (FOBT) of the stool. You may have this test every year starting at age 17. Flexible sigmoidoscopy or colonoscopy. You may have a sigmoidoscopy every 5 years or a colonoscopy every 10 years starting at age 20. Hepatitis C blood test. Hepatitis B blood test. Sexually transmitted disease (STD) testing. Diabetes screening. This is done by checking your blood sugar (glucose) after you have not eaten for a while (fasting). You may have this done every 1-3  years. Bone density scan. This is done to screen for osteoporosis. You may have this done starting at age 80. Mammogram. This may be done every 1-2 years. Talk to your health care provider about how often you should have regular mammograms. Talk with your health care provider about your test results, treatment options, and if necessary, the need for more tests. Vaccines  Your health care provider may recommend certain vaccines, such as: Influenza vaccine. This is  recommended every year. Tetanus, diphtheria, and acellular pertussis (Tdap, Td) vaccine. You may need a Td booster every 10 years. Zoster vaccine. You may need this after age 76. Pneumococcal 13-valent conjugate (PCV13) vaccine. One dose is recommended after age 66. Pneumococcal polysaccharide (PPSV23) vaccine. One dose is recommended after age 81. Talk to your health care provider about which screenings and vaccines you need and how often you need them. This information is not intended to replace advice given to you by your health care provider. Make sure you discuss any questions you have with your health care provider. Document Released: 08/17/2015 Document Revised: 04/09/2016 Document Reviewed: 05/22/2015 Elsevier Interactive Patient Education  2017 [employer] Prevention in the Home Falls can cause injuries. They can happen to people of all ages. There are many things you can do to make your home safe and to help prevent falls. What can I do on the outside of my home? Regularly fix the edges of walkways and driveways and fix any cracks. Remove anything that might make you trip as you walk through a door, such as a raised step or threshold. Trim any bushes or trees on the path to your home. Use bright outdoor lighting. Clear any walking paths of anything that might make someone trip, such as rocks or tools. Regularly check to see if handrails are loose or broken. Make sure that both sides of any steps have handrails. Any raised decks and porches should have guardrails on the edges. Have any leaves, snow, or ice cleared regularly. Use sand or salt on walking paths during winter. Clean up any spills in your garage right away. This includes oil or grease spills. What can I do in the bathroom? Use night lights. Install grab bars by the toilet and in the tub and shower. Do not use towel bars as grab bars. Use non-skid mats or decals in the tub or shower. If you need to sit down in  the shower, use a plastic, non-slip stool. Keep the floor dry. Clean up any water that spills on the floor as soon as it happens. Remove soap buildup in the tub or shower regularly. Attach bath mats securely with double-sided non-slip rug tape. Do not have throw rugs and other things on the floor that can make you trip. What can I do in the bedroom? Use night lights. Make sure that you have a light by your bed that is easy to reach. Do not use any sheets or blankets that are too big for your bed. They should not hang down onto the floor. Have a firm chair that has side arms. You can use this for support while you get dressed. Do not have throw rugs and other things on the floor that can make you trip. What can I do in the kitchen? Clean up any spills right away. Avoid walking on wet floors. Keep items that you use a lot in easy-to-reach places. If you need to reach something above you, use a strong step stool that has a grab  bar. Keep electrical cords out of the way. Do not use floor polish or wax that makes floors slippery. If you must use wax, use non-skid floor wax. Do not have throw rugs and other things on the floor that can make you trip. What can I do with my stairs? Do not leave any items on the stairs. Make sure that there are handrails on both sides of the stairs and use them. Fix handrails that are broken or loose. Make sure that handrails are as long as the stairways. Check any carpeting to make sure that it is firmly attached to the stairs. Fix any carpet that is loose or worn. Avoid having throw rugs at the top or bottom of the stairs. If you do have throw rugs, attach them to the floor with carpet tape. Make sure that you have a light switch at the top of the stairs and the bottom of the stairs. If you do not have them, ask someone to add them for you. What else can I do to help prevent falls? Wear shoes that: Do not have high heels. Have rubber bottoms. Are comfortable  and fit you well. Are closed at the toe. Do not wear sandals. If you use a stepladder: Make sure that it is fully opened. Do not climb a closed stepladder. Make sure that both sides of the stepladder are locked into place. Ask someone to hold it for you, if possible. Clearly mark and make sure that you can see: Any grab bars or handrails. First and last steps. Where the edge of each step is. Use tools that help you move around (mobility aids) if they are needed. These include: Canes. Walkers. Scooters. Crutches. Turn on the lights when you go into a dark area. Replace any light bulbs as soon as they burn out. Set up your furniture so you have a clear path. Avoid moving your furniture around. If any of your floors are uneven, fix them. If there are any pets around you, be aware of where they are. Review your medicines with your doctor. Some medicines can make you feel dizzy. This can increase your chance of falling. Ask your doctor what other things that you can do to help prevent falls. This information is not intended to replace advice given to you by your health care provider. Make sure you discuss any questions you have with your health care provider. Document Released: 05/17/2009 Document Revised: 12/27/2015 Document Reviewed: 08/25/2014 Elsevier Interactive Patient Education  2017 [employer].

## 2022-08-11 NOTE — Progress Notes (Signed)
Subjective:   Suzanne Morris is a 82 y.o. female who presents for Medicare Annual (Subsequent) preventive examination.  Review of Systems    Virtual Visit via Telephone Note  I connected with  Suzanne Morris on 08/11/22 at  1:00 PM EST by telephone and verified that I am speaking with the correct person using two identifiers.  Location: Patient: Home Provider: Office Persons participating in the virtual visit: patient/Nurse Health Advisor   I discussed the limitations, risks, security and privacy concerns of performing an evaluation and management service by telephone and the availability of in person appointments. The patient expressed understanding and agreed to proceed.  Interactive audio and video telecommunications were attempted between this nurse and patient, however failed, due to patient having technical difficulties OR patient did not have access to video capability.  We continued and completed visit with audio only.  Some vital signs may be absent or patient reported.   [name], LPN  Cardiac Risk Factors include: advanced age (>82men, >64 women);hypertension     Objective:    Today's Vitals   08/11/22 1300  Weight: 128 lb (58.1 kg)  Height: 5\' 2"  (1.575 m)   Body mass index is 23.41 kg/m.     08/11/2022    1:06 PM 07/15/2021    9:45 AM 07/11/2020    8:08 AM  Advanced Directives  Does Patient Have a Medical Advance Directive? Yes Yes Yes  Type of Paramedic of [place];Living will [place];Living will   Does patient want to make changes to medical advance directive?   No - Patient declined  Copy of [place] in Chart? No - copy requested No - copy requested     Current Medications (verified) Outpatient Encounter Medications as of 08/11/2022  Medication Sig   Cholecalciferol (VITAMIN D3) 50 MCG (2000 UT) capsule Take 1 capsule (2,000 Units total) by mouth daily.   clonazePAM  (KLONOPIN) 0.5 MG tablet Take 0.5 tablets (0.25 mg total) by mouth daily as needed for anxiety.   Coenzyme Q10 (COQ10 PO) Take by mouth daily.   Multiple Vitamins-Minerals (MULTIVITAMIN ADULT) CHEW Chew by mouth.   rosuvastatin (CRESTOR) 5 MG tablet TAKE 1 TABLET BY MOUTH AT  BEDTIME   No facility-administered encounter medications on file as of 08/11/2022.    Allergies (verified) Patient has no known allergies.   History: Past Medical History:  Diagnosis Date   Allergy    History of chickenpox    History reviewed. No pertinent surgical history. Family History  Problem Relation Age of Onset   Hypertension Mother    Kidney disease Son        underdeveloped kidneys, transplant   Other Father        rheumatic fever   COPD Father        smoker   Heart failure Father    Social History   Socioeconomic History   Marital status: Married    Spouse name: Not on file   Number of children: Not on file   Years of education: Not on file   Highest education level: Not on file  Occupational History   Occupation: Retired  Tobacco Use   Smoking status: Former    Packs/day: 0.25    Years: 2.00    Total pack years: 0.50    Types: Cigarettes   Smokeless tobacco: Never  Vaping Use   Vaping Use: Never used  Substance and Sexual Activity   Alcohol use:  Yes    Alcohol/week: 3.0 - 4.0 standard drinks of alcohol    Types: 3 - 4 Glasses of wine per week   Drug use: Never   Sexual activity: Yes    Partners: Male  Other Topics Concern   Not on file  Social History Narrative   Not on file   Social Determinants of Health   Financial Resource Strain: Low Risk  (08/11/2022)   Overall Financial Resource Strain (CARDIA)    Difficulty of Paying Living Expenses: Not hard at all  Food Insecurity: No Food Insecurity (08/11/2022)   Hunger Vital Sign    Worried About Running Out of Food in the Last Year: Never true    Ran Out of Food in the Last Year: Never true  Transportation Needs: No  Transportation Needs (08/11/2022)   PRAPARE - Administrator, Civil Service (Medical): No    Lack of Transportation (Non-Medical): No  Physical Activity: Insufficiently Active (08/11/2022)   Exercise Vital Sign    Days of Exercise per Week: 3 days    Minutes of Exercise per Session: 40 min  Stress: No Stress Concern Present (08/11/2022)   [employer] of Occupational Health - Occupational Stress Questionnaire    Feeling of Stress : Not at all  Social Connections: Socially Integrated (08/11/2022)   Social Connection and Isolation Panel [NHANES]    Frequency of Communication with Friends and Family: More than three times a week    Frequency of Social Gatherings with Friends and Family: More than three times a week    Attends Religious Services: More than 4 times per year    Active Member of [employer] or Organizations: Yes    Attends Engineer, structural: More than 4 times per year    Marital Status: Married    Tobacco Counseling Counseling given: Not Answered   Clinical Intake:  Pre-visit preparation completed: No  Pain : No/denies pain     BMI - recorded: 23.41 Nutritional Status: BMI of 19-24  Normal Nutritional Risks: None Diabetes: No  How often do you need to have someone help you when you read instructions, pamphlets, or other written materials from your doctor or pharmacy?: 1 - Never  Diabetic?  No  Interpreter Needed?: No  Information entered by :: [name] LPN   Activities of Daily Living    08/11/2022    1:05 PM  In your present state of health, do you have any difficulty performing the following activities:  Hearing? 0  Vision? 0  Difficulty concentrating or making decisions? 0  Walking or climbing stairs? 0  Dressing or bathing? 0  Doing errands, shopping? 0  Preparing Food and eating ? N  Using the Toilet? N  In the past six months, have you accidently leaked urine? N  Do you have problems with loss of bowel control? N   Managing your Medications? N  Managing your Finances? N  Housekeeping or managing your Housekeeping? N    Patient Care Team: Karie Georges, MD as PCP - General (Family Medicine)  Indicate any recent Medical Services you may have received from other than [facility] providers in the past year (date may be approximate).     Assessment:   This is a routine wellness examination for [name]  Hearing/Vision screen Hearing Screening - Comments:: Denies hearing difficulties   Vision Screening - Comments:: Wears rx glasses - up to date with routine eye exams with  Dr Hyacinth Meeker  Dietary issues and exercise activities discussed:  Exercise limited by: None identified   Goals Addressed               This Visit's Progress     Stay Active (pt-stated)        Stay Healthy.       Depression Screen    08/11/2022    1:04 PM 07/15/2021    9:46 AM 02/06/2021    2:29 PM 07/11/2020    8:07 AM  PHQ 2/9 Scores  PHQ - 2 Score 0 0 0 0  PHQ- 9 Score   2     Fall Risk    08/11/2022    1:05 PM 08/20/2021   12:51 PM 07/15/2021    9:46 AM 02/06/2021    1:07 PM 07/11/2020    8:09 AM  Fall Risk   Falls in the past year? 0 0 0 0 0  Number falls in past yr: 0   0 0  Injury with Fall? 0    0  Risk for fall due to : No Fall Risks  Medication side effect  Impaired vision  Follow up Falls prevention discussed  Falls evaluation completed;Education provided;Falls prevention discussed  Falls prevention discussed    FALL RISK PREVENTION PERTAINING TO THE HOME:  Any stairs in or around the home? Yes  If so, are there any without handrails? No  Home free of loose throw rugs in walkways, pet beds, electrical cords, etc? Yes  Adequate lighting in your home to reduce risk of falls? Yes   ASSISTIVE DEVICES UTILIZED TO PREVENT FALLS:  Life alert? No  Use of a cane, walker or w/c? No  Grab bars in the bathroom? Yes  Shower chair or bench in shower? Yes  Elevated toilet seat or a handicapped toilet? No    TIMED UP AND GO:  Was the test performed? No . Audio Visit   Cognitive Function:        08/11/2022    1:06 PM 07/15/2021    9:50 AM 07/11/2020    8:11 AM  6CIT Screen  What Year? 0 points 0 points 0 points  What month? 0 points 0 points 0 points  What time? 0 points 0 points   Count back from 20 0 points 0 points 0 points  Months in reverse 0 points 0 points 0 points  Repeat phrase 0 points 0 points 0 points  Total Score 0 points 0 points     Immunizations Immunization History  Administered Date(s) Administered   Fluad Quad(high Dose 65+) 07/10/2021   PFIZER(Purple Top)SARS-COV-2 Vaccination 09/05/2019, 10/03/2019, 07/30/2020   PNEUMOCOCCAL CONJUGATE-20 03/04/2021   Zoster Recombinat (Shingrix) 08/10/2020    TDAP status: Due, Education has been provided regarding the importance of this vaccine. Advised may receive this vaccine at local pharmacy or Health Dept. Aware to provide a copy of the vaccination record if obtained from local pharmacy or Health Dept. Verbalized acceptance and understanding.  Flu Vaccine status: Declined, Education has been provided regarding the importance of this vaccine but patient still declined. Advised may receive this vaccine at local pharmacy or Health Dept. Aware to provide a copy of the vaccination record if obtained from local pharmacy or Health Dept. Verbalized acceptance and understanding.  Pneumococcal vaccine status: Up to date  Covid-19 vaccine status: Completed vaccines  Qualifies for Shingles Vaccine? Yes   Zostavax completed Yes   Shingrix Completed?: Yes  Screening Tests Health Maintenance  Topic Date Due   DTaP/Tdap/Td (1 - Tdap) Never done   COVID-19  Vaccine (4 - 2023-24 season) 08/27/2022 (Originally 04/04/2022)   Zoster Vaccines- Shingrix (2 of 2) 09/04/2022 (Originally 10/05/2020)   INFLUENZA VACCINE  11/03/2022 (Originally 03/04/2022)   Medicare Annual Wellness (AWV)  08/12/2023   Pneumonia Vaccine 5+ Years old  Completed    DEXA SCAN  Completed   HPV VACCINES  Aged Out    Health Maintenance  Health Maintenance Due  Topic Date Due   DTaP/Tdap/Td (1 - Tdap) Never done    Colorectal cancer screening: No longer required.   Mammogram status: No longer required due to Age.  Bone Density status: Completed 04/02/21. Results reflect: Bone density results: OSTEOPOROSIS. Repeat every   years.  Lung Cancer Screening: (Low Dose CT Chest recommended if Age 75-80 years, 30 pack-year currently smoking OR have quit w/in 15years.) does not qualify.     Additional Screening:  Hepatitis C Screening: does not qualify; Completed   Vision Screening: Recommended annual ophthalmology exams for early detection of glaucoma and other disorders of the eye. Is the patient up to date with their annual eye exam?  Yes  Who is the provider or what is the name of the office in which the patient attends annual eye exams? Dr Hyacinth Meeker If pt is not established with a provider, would they like to be referred to a provider to establish care? No .   Dental Screening: Recommended annual dental exams for proper oral hygiene  Community Resource Referral / Chronic Care Management:  CRR required this visit?  Yes   CCM required this visit?  Yes      Plan:     I have personally reviewed and noted the following in the patient's chart:   Medical and social history Use of alcohol, tobacco or illicit drugs  Current medications and supplements including opioid prescriptions. Patient is not currently taking opioid prescriptions. Functional ability and status Nutritional status Physical activity Advanced directives List of other physicians Hospitalizations, surgeries, and ER visits in previous 12 months Vitals Screenings to include cognitive, depression, and falls Referrals and appointments  In addition, I have reviewed and discussed with patient certain preventive protocols, quality metrics, and best practice recommendations. A  written personalized care plan for preventive services as well as general preventive health recommendations were provided to patient.     Tillie Rung, LPN   04/06/8100   Nurse Notes: None

## 2022-08-26 ENCOUNTER — Other Ambulatory Visit (INDEPENDENT_AMBULATORY_CARE_PROVIDER_SITE_OTHER): Payer: Medicare Other

## 2022-08-26 DIAGNOSIS — I10 Essential (primary) hypertension: Secondary | ICD-10-CM

## 2022-08-26 DIAGNOSIS — R739 Hyperglycemia, unspecified: Secondary | ICD-10-CM

## 2022-08-26 DIAGNOSIS — E782 Mixed hyperlipidemia: Secondary | ICD-10-CM | POA: Diagnosis not present

## 2022-08-26 LAB — COMPREHENSIVE METABOLIC PANEL
ALT: 22 U/L (ref 0–35)
AST: 25 U/L (ref 0–37)
Albumin: 4.3 g/dL (ref 3.5–5.2)
Alkaline Phosphatase: 72 U/L (ref 39–117)
BUN: 20 mg/dL (ref 6–23)
CO2: 25 mEq/L (ref 19–32)
Calcium: 9.1 mg/dL (ref 8.4–10.5)
Chloride: 105 mEq/L (ref 96–112)
Creatinine, Ser: 0.91 mg/dL (ref 0.40–1.20)
GFR: 59.11 mL/min — ABNORMAL LOW (ref >60.00)
Glucose, Bld: 103 mg/dL — ABNORMAL HIGH (ref 70–99)
Potassium: 3.7 mEq/L (ref 3.5–5.1)
Sodium: 139 mEq/L (ref 135–145)
Total Bilirubin: 0.5 mg/dL (ref 0.2–1.2)
Total Protein: 7.1 g/dL (ref 6.0–8.3)

## 2022-08-26 LAB — LIPID PANEL
Cholesterol: 142 mg/dL (ref 0–200)
HDL: 54.6 mg/dL (ref >39.00)
LDL Cholesterol: 77 mg/dL (ref 0–99)
NonHDL: 87.54
Total CHOL/HDL Ratio: 3
Triglycerides: 53 mg/dL (ref 0.0–149.0)
VLDL: 10.6 mg/dL (ref 0.0–40.0)

## 2022-08-26 LAB — HEMOGLOBIN A1C: Hgb A1c MFr Bld: 5.7 % (ref 4.6–6.5)

## 2022-08-26 LAB — TSH: TSH: 9.93 u[IU]/mL — ABNORMAL HIGH (ref 0.35–5.50)

## 2022-08-27 ENCOUNTER — Other Ambulatory Visit (INDEPENDENT_AMBULATORY_CARE_PROVIDER_SITE_OTHER): Payer: Medicare Other

## 2022-08-27 DIAGNOSIS — R946 Abnormal results of thyroid function studies: Secondary | ICD-10-CM | POA: Diagnosis not present

## 2022-08-27 LAB — T4, FREE: Free T4: 0.65 ng/dL (ref 0.60–1.60)

## 2022-08-27 LAB — T3, FREE: T3, Free: 2.5 pg/mL (ref 2.3–4.2)

## 2022-08-28 NOTE — Progress Notes (Signed)
T4 and t3 are normal, I will recheck her TSH at her next visit-- I don't think she needs medication at this time.

## 2023-03-20 ENCOUNTER — Encounter: Payer: Self-pay | Admitting: Family Medicine

## 2023-03-20 ENCOUNTER — Ambulatory Visit: Payer: Medicare Other | Admitting: Family Medicine

## 2023-03-20 VITALS — BP 152/92 | HR 90 | Temp 99.0°F | Ht 62.0 in | Wt 129.2 lb

## 2023-03-20 DIAGNOSIS — L989 Disorder of the skin and subcutaneous tissue, unspecified: Secondary | ICD-10-CM

## 2023-03-20 NOTE — Progress Notes (Signed)
   Acute Office Visit  Subjective:     Patient ID: Suzanne Morris, female    DOB: October 06, 1940, 82 y.o.   MRN: [id number]  Chief Complaint  Patient presents with   Skin lesion noted left chin area x2 months, after waxing    HPI Patient is in today for a new skin lesion on the chin. Has been there for about 2 months, states that she gets her face waxed and she developed a patch of skin that started bleeding, states that there was a little bump there before she had her face waxed. Since then the area has not healed. It's not bleeding but the skin "won't close up" . States she does not think it has gotten bigger, size is about the same.   Review of Systems  All other systems reviewed and are negative.       Objective:    BP (!) 152/92 (BP Location: Right Arm, Patient Position: Sitting, Cuff Size: Normal)   Pulse 90   Temp 99 F (37.2 C) (Oral)   Ht 5\' 2"  (1.575 m)   Wt 129 lb 3.2 oz (58.6 kg)   SpO2 99%   BMI 23.63 kg/m    Physical Exam Constitutional:      Appearance: Normal appearance. She is normal weight.  Neurological:     Mental Status: She is alert and oriented to person, place, and time. Mental status is at baseline.  Psychiatric:        Mood and Affect: Mood normal.        Behavior: Behavior normal.     No results found for any visits on 03/20/23.      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 I advised referral to dermatology for treatment and recommendations. I will see her back in January for her regular follow up. There  is no drainage but there is a shallow central ulceration, with a raised lesion around the ulceration.   No orders of the defined types were placed in this encounter.   No follow-ups on file.  Karie Georges, MD

## 2023-03-30 ENCOUNTER — Encounter: Payer: Self-pay | Admitting: Family Medicine

## 2023-03-30 DIAGNOSIS — L989 Disorder of the skin and subcutaneous tissue, unspecified: Secondary | ICD-10-CM

## 2023-03-31 NOTE — Telephone Encounter (Signed)
Ok to change the referral to Dr. Sharyn Lull

## 2023-04-02 ENCOUNTER — Other Ambulatory Visit: Payer: Self-pay | Admitting: Family Medicine

## 2023-04-02 DIAGNOSIS — F419 Anxiety disorder, unspecified: Secondary | ICD-10-CM

## 2023-04-02 NOTE — Telephone Encounter (Signed)
Spoke with the patient and informed her PCP is out of the office until 9/3.  Patient stated she is OK to await review by PCP on 9/3

## 2023-04-16 ENCOUNTER — Other Ambulatory Visit: Payer: Self-pay | Admitting: Family Medicine

## 2023-04-16 DIAGNOSIS — E782 Mixed hyperlipidemia: Secondary | ICD-10-CM

## 2023-06-18 ENCOUNTER — Other Ambulatory Visit: Payer: Self-pay | Admitting: Family Medicine

## 2023-06-18 DIAGNOSIS — E782 Mixed hyperlipidemia: Secondary | ICD-10-CM

## 2023-08-19 ENCOUNTER — Ambulatory Visit: Payer: Medicare Other

## 2023-08-19 VITALS — Ht 62.0 in | Wt 129.0 lb

## 2023-08-19 DIAGNOSIS — Z0000 Encounter for general adult medical examination without abnormal findings: Secondary | ICD-10-CM | POA: Diagnosis not present

## 2023-08-19 NOTE — Patient Instructions (Addendum)
 Ms. Whitling , Thank you for taking time to come for your Medicare Wellness Visit. I appreciate your ongoing commitment to your health goals. Please review the following plan we discussed and let me know if I can assist you in the future.   Referrals/Orders/Follow-Ups/Clinician Recommendations:   This is a list of the screening recommended for you and due dates:  Health Maintenance  Topic Date Due   DTaP/Tdap/Td vaccine (1 - Tdap) Never done   Zoster (Shingles) Vaccine (2 of 2) 10/05/2020   Flu Shot  03/05/2023   COVID-19 Vaccine (4 - 2024-25 season) 04/05/2023   Medicare Annual Wellness Visit  08/18/2024   Pneumonia Vaccine  Completed   DEXA scan (bone density measurement)  Completed   HPV Vaccine  Aged Out    Advanced directives: (Copy Requested) Please bring a copy of your health care power of attorney and living will to the office to be added to your chart at your convenience.  Next Medicare Annual Wellness Visit scheduled for next year: Yes

## 2023-08-19 NOTE — Progress Notes (Signed)
 Subjective:   Suzanne Morris is a 83 y.o. female who presents for Medicare Annual (Subsequent) preventive examination.  Visit Complete: Virtual I connected with  Suzanne Morris on 08/19/23 by a audio enabled telemedicine application and verified that I am speaking with the correct person using two identifiers.  Patient Location: Home  Provider Location: Home Office  I discussed the limitations of evaluation and management by telemedicine. The patient expressed understanding and agreed to proceed.  Vital Signs: Because this visit was a virtual/telehealth visit, some criteria may be missing or patient reported. Any vitals not documented were not able to be obtained and vitals that have been documented are patient reported.  Patient Medicare AWV questionnaire was completed by the patient on 08/05/23; I have confirmed that all information answered by patient is correct and no changes since this date.  Cardiac Risk Factors include: advanced age (>69men, >67 women);hypertension     Objective:    Today's Vitals   08/19/23 1308  Weight: 129 lb (58.5 kg)  Height: 5\' 2"  (1.575 m)   Body mass index is 23.59 kg/m.     08/19/2023    1:16 PM 08/11/2022    1:06 PM 07/15/2021    9:45 AM 07/11/2020    8:08 AM  Advanced Directives  Does Patient Have a Medical Advance Directive? Yes Yes Yes Yes  Type of Estate agent of [place];Living will Healthcare Power of [place];Living will Healthcare Power of [place];Living will   Does patient want to make changes to medical advance directive?    No - Patient declined  Copy of Healthcare Power of Attorney in Chart? No - copy requested No - copy requested No - copy requested     Current Medications (verified) Outpatient Encounter Medications as of 08/19/2023  Medication Sig   Cholecalciferol (VITAMIN D3) 50 MCG (2000 UT) capsule Take 1 capsule (2,000 Units total) by mouth daily.   clonazePAM  (KLONOPIN ) 0.5 MG tablet TAKE  1/2 (0.5) TABLETS BY MOUTH DAILY AS NEEDED FOR ANXIETY   Coenzyme Q10 (COQ10 PO) Take by mouth daily.   Multiple Vitamins-Minerals (MULTIVITAMIN ADULT) CHEW Chew by mouth.   rosuvastatin  (CRESTOR ) 5 MG tablet TAKE 1 TABLET BY MOUTH AT  BEDTIME   No facility-administered encounter medications on file as of 08/19/2023.    Allergies (verified) Patient has no known allergies.   History: Past Medical History:  Diagnosis Date   Allergy    History of chickenpox    History reviewed. No pertinent surgical history. Family History  Problem Relation Age of Onset   Hypertension Mother    Kidney disease Son        underdeveloped kidneys, transplant   Other Father        rheumatic fever   COPD Father        smoker   Heart failure Father    Social History   Socioeconomic History   Marital status: Married    Spouse name: Not on file   Number of children: Not on file   Years of education: Not on file   Highest education level: Not on file  Occupational History   Occupation: Retired  Tobacco Use   Smoking status: Former    Current packs/day: 0.25    Average packs/day: 0.3 packs/day for 2.0 years (0.5 ttl pk-yrs)    Types: Cigarettes   Smokeless tobacco: Never  Vaping Use   Vaping status: Never Used  Substance and Sexual Activity   Alcohol use: Yes  Alcohol/week: 3.0 - 4.0 standard drinks of alcohol    Types: 3 - 4 Glasses of wine per week   Drug use: Never   Sexual activity: Yes    Partners: Male  Other Topics Concern   Not on file  Social History Narrative   Not on file   Social Drivers of Health   Financial Resource Strain: Low Risk  (08/19/2023)   Overall Financial Resource Strain (CARDIA)    Difficulty of Paying Living Expenses: Not hard at all  Food Insecurity: No Food Insecurity (08/19/2023)   Hunger Vital Sign    Worried About Running Out of Food in the Last Year: Never true    Ran Out of Food in the Last Year: Never true  Transportation Needs: No Transportation  Needs (08/19/2023)   PRAPARE - Administrator, Civil Service (Medical): No    Lack of Transportation (Non-Medical): No  Physical Activity: Sufficiently Active (08/19/2023)   Exercise Vital Sign    Days of Exercise per Week: 3 days    Minutes of Exercise per Session: 60 min  Stress: No Stress Concern Present (08/19/2023)   [employer] of Occupational Health - Occupational Stress Questionnaire    Feeling of Stress : Not at all  Social Connections: Socially Integrated (08/19/2023)   Social Connection and Isolation Panel [NHANES]    Frequency of Communication with Friends and Family: More than three times a week    Frequency of Social Gatherings with Friends and Family: More than three times a week    Attends Religious Services: More than 4 times per year    Active Member of [employer] or Organizations: Yes    Attends Engineer, structural: More than 4 times per year    Marital Status: Married    Tobacco Counseling Counseling given: Not Answered   Clinical Intake:  Pre-visit preparation completed: Yes  Pain : No/denies pain     BMI - recorded: 23.59 Nutritional Risks: None Diabetes: No  How often do you need to have someone help you when you read instructions, pamphlets, or other written materials from your doctor or pharmacy?: 1 - Never  Interpreter Needed?: No  Information entered by :: Farris Hong LPN   Activities of Daily Living    08/19/2023    1:14 PM 08/18/2023    4:45 PM  In your present state of health, do you have any difficulty performing the following activities:  Hearing? 0 0  Vision? 0 0  Difficulty concentrating or making decisions? 0 0  Walking or climbing stairs? 0 0  Dressing or bathing? 0 0  Doing errands, shopping? 0 0  Preparing Food and eating ? N N  Using the Toilet? N N  In the past six months, have you accidently leaked urine? N N  Do you have problems with loss of bowel control? N N  Managing your Medications? N    Managing your Finances? N N  Housekeeping or managing your Housekeeping? N N    Patient Care Team: Aida House, MD as PCP - General (Family Medicine)  Indicate any recent Medical Services you may have received from other than [facility] providers in the past year (date may be approximate).     Assessment:   This is a routine wellness examination for Ilka.  Hearing/Vision screen Hearing Screening - Comments:: Denies hearing difficulties   Vision Screening - Comments:: Wears rx glasses - up to date with routine eye exams with  [facility]   Goals  Addressed               This Visit's Progress     Increase physical activity (pt-stated)        Stay Active.       Depression Screen    08/19/2023    1:13 PM 08/11/2022    1:04 PM 07/15/2021    9:46 AM 02/06/2021    2:29 PM 07/11/2020    8:07 AM  PHQ 2/9 Scores  PHQ - 2 Score 0 0 0 0 0  PHQ- 9 Score    2     Fall Risk    08/19/2023    1:14 PM 08/18/2023    4:45 PM 03/20/2023    1:08 PM 08/11/2022    1:05 PM 08/20/2021   12:51 PM  Fall Risk   Falls in the past year? 0 1 0 0 0  Number falls in past yr: 0 0 0 0   Injury with Fall? 0 0 0 0   Risk for fall due to : No Fall Risks  No Fall Risks No Fall Risks   Follow up Falls prevention discussed  Falls evaluation completed Falls prevention discussed     MEDICARE RISK AT HOME: Medicare Risk at Home Any stairs in or around the home?: (Patient-Rptd) Yes If so, are there any without handrails?: (Patient-Rptd) Yes Home free of loose throw rugs in walkways, pet beds, electrical cords, etc?: (Patient-Rptd) Yes Adequate lighting in your home to reduce risk of falls?: (Patient-Rptd) Yes Life alert?: (Patient-Rptd) No Use of a cane, walker or w/c?: (Patient-Rptd) No Grab bars in the bathroom?: (Patient-Rptd) Yes Shower chair or bench in shower?: (Patient-Rptd) No Elevated toilet seat or a handicapped toilet?: (Patient-Rptd) No  TIMED UP AND GO:  Was the test performed?  No     Cognitive Function:        08/19/2023    1:16 PM 08/11/2022    1:06 PM 07/15/2021    9:50 AM 07/11/2020    8:11 AM  6CIT Screen  What Year? 0 points 0 points 0 points 0 points  What month? 0 points 0 points 0 points 0 points  What time? 0 points 0 points 0 points   Count back from 20 0 points 0 points 0 points 0 points  Months in reverse 0 points 0 points 0 points 0 points  Repeat phrase 0 points 0 points 0 points 0 points  Total Score 0 points 0 points 0 points     Immunizations Immunization History  Administered Date(s) Administered   Fluad Quad(high Dose 65+) 07/10/2021   PFIZER(Purple Top)SARS-COV-2 Vaccination 09/05/2019, 10/03/2019, 07/30/2020   PNEUMOCOCCAL CONJUGATE-20 03/04/2021   Zoster Recombinant(Shingrix ) 08/10/2020    TDAP status: Due, Education has been provided regarding the importance of this vaccine. Advised may receive this vaccine at local pharmacy or Health Dept. Aware to provide a copy of the vaccination record if obtained from local pharmacy or Health Dept. Verbalized acceptance and understanding.  Flu Vaccine status: Due, Education has been provided regarding the importance of this vaccine. Advised may receive this vaccine at local pharmacy or Health Dept. Aware to provide a copy of the vaccination record if obtained from local pharmacy or Health Dept. Verbalized acceptance and understanding.  Pneumococcal vaccine status: Up to date  Covid-19 vaccine status: Declined, Education has been provided regarding the importance of this vaccine but patient still declined. Advised may receive this vaccine at local pharmacy or Health Dept.or vaccine clinic. Aware to provide a copy  of the vaccination record if obtained from local pharmacy or Health Dept. Verbalized acceptance and understanding.  Qualifies for Shingles Vaccine? Yes   Zostavax completed No   Shingrix  Completed?: No.    Education has been provided regarding the importance of this vaccine. Patient has  been advised to call insurance company to determine out of pocket expense if they have not yet received this vaccine. Advised may also receive vaccine at local pharmacy or Health Dept. Verbalized acceptance and understanding.  Screening Tests Health Maintenance  Topic Date Due   DTaP/Tdap/Td (1 - Tdap) Never done   Zoster Vaccines- Shingrix  (2 of 2) 10/05/2020   INFLUENZA VACCINE  03/05/2023   COVID-19 Vaccine (4 - 2024-25 season) 04/05/2023   Medicare Annual Wellness (AWV)  08/18/2024   Pneumonia Vaccine 79+ Years old  Completed   DEXA SCAN  Completed   HPV VACCINES  Aged Out    Health Maintenance  Health Maintenance Due  Topic Date Due   DTaP/Tdap/Td (1 - Tdap) Never done   Zoster Vaccines- Shingrix  (2 of 2) 10/05/2020   INFLUENZA VACCINE  03/05/2023   COVID-19 Vaccine (4 - 2024-25 season) 04/05/2023        Bone Density status: Completed 04/02/21. Results reflect: Bone density results: OSTEOPENIA. Repeat every   years.   Additional Screening:    Vision Screening: Recommended annual ophthalmology exams for early detection of glaucoma and other disorders of the eye. Is the patient up to date with their annual eye exam?  Yes  Who is the provider or what is the name of the office in which the patient attends annual eye exams? [facility] If pt is not established with a provider, would they like to be referred to a provider to establish care? No .   Dental Screening: Recommended annual dental exams for proper oral hygiene    Community Resource Referral / Chronic Care Management:  CRR required this visit?  No   CCM required this visit?  No     Plan:     I have personally reviewed and noted the following in the patient's chart:   Medical and social history Use of alcohol, tobacco or illicit drugs  Current medications and supplements including opioid prescriptions. Patient is not currently taking opioid prescriptions. Functional ability and status Nutritional  status Physical activity Advanced directives List of other physicians Hospitalizations, surgeries, and ER visits in previous 12 months Vitals Screenings to include cognitive, depression, and falls Referrals and appointments  In addition, I have reviewed and discussed with patient certain preventive protocols, quality metrics, and best practice recommendations. A written personalized care plan for preventive services as well as general preventive health recommendations were provided to patient.     Dewayne Ford, LPN   [id number]/61/0960   After Visit Summary: (MyChart) Due to this being a telephonic visit, the after visit summary with patients personalized plan was offered to patient via MyChart   Nurse Notes: None

## 2023-09-03 ENCOUNTER — Other Ambulatory Visit: Payer: Self-pay | Admitting: Family Medicine

## 2023-09-03 DIAGNOSIS — E782 Mixed hyperlipidemia: Secondary | ICD-10-CM

## 2023-11-05 ENCOUNTER — Other Ambulatory Visit: Payer: Self-pay | Admitting: Family Medicine

## 2023-11-05 DIAGNOSIS — E782 Mixed hyperlipidemia: Secondary | ICD-10-CM

## 2023-11-27 ENCOUNTER — Other Ambulatory Visit: Payer: Self-pay | Admitting: Family Medicine

## 2023-11-27 DIAGNOSIS — E782 Mixed hyperlipidemia: Secondary | ICD-10-CM

## 2023-12-18 ENCOUNTER — Other Ambulatory Visit: Payer: Self-pay | Admitting: Family Medicine

## 2023-12-18 DIAGNOSIS — E782 Mixed hyperlipidemia: Secondary | ICD-10-CM

## 2024-01-15 ENCOUNTER — Other Ambulatory Visit: Payer: Self-pay | Admitting: Family Medicine

## 2024-01-15 DIAGNOSIS — E782 Mixed hyperlipidemia: Secondary | ICD-10-CM

## 2024-02-05 ENCOUNTER — Other Ambulatory Visit: Payer: Self-pay | Admitting: Family Medicine

## 2024-02-05 DIAGNOSIS — E782 Mixed hyperlipidemia: Secondary | ICD-10-CM

## 2024-02-08 NOTE — Telephone Encounter (Signed)
 Pt is coming up on 1 year, needs appt scheduled with me for August for her annual and then I can fill her script. Please call patient and have her schedule her yearly visit in August.

## 2024-02-26 ENCOUNTER — Other Ambulatory Visit: Payer: Self-pay | Admitting: Family Medicine

## 2024-02-26 DIAGNOSIS — E782 Mixed hyperlipidemia: Secondary | ICD-10-CM

## 2024-11-04 ENCOUNTER — Ambulatory Visit
